# Patient Record
Sex: Male | Born: 1945 | Race: White | Hispanic: No | Marital: Married | Smoking: Current every day smoker
Health system: Southern US, Community
[De-identification: ages and names within clinical notes are randomized; demographics above are authoritative.]

## PROBLEM LIST (undated history)

## (undated) DIAGNOSIS — IMO0001 Reserved for inherently not codable concepts without codable children: Secondary | ICD-10-CM

## (undated) DIAGNOSIS — I4891 Unspecified atrial fibrillation: Secondary | ICD-10-CM

## (undated) DIAGNOSIS — I10 Essential (primary) hypertension: Secondary | ICD-10-CM

## (undated) DIAGNOSIS — I499 Cardiac arrhythmia, unspecified: Secondary | ICD-10-CM

## (undated) DIAGNOSIS — I509 Heart failure, unspecified: Secondary | ICD-10-CM

## (undated) DIAGNOSIS — J449 Chronic obstructive pulmonary disease, unspecified: Secondary | ICD-10-CM

---

## 2004-08-07 ENCOUNTER — Ambulatory Visit: Payer: Self-pay

## 2014-05-27 ENCOUNTER — Ambulatory Visit: Payer: Self-pay | Admitting: Internal Medicine

## 2014-06-11 ENCOUNTER — Inpatient Hospital Stay: Payer: Self-pay | Admitting: Internal Medicine

## 2014-06-11 LAB — BASIC METABOLIC PANEL
Anion Gap: 6 — ABNORMAL LOW (ref 7–16)
BUN: 14 mg/dL (ref 7–18)
CHLORIDE: 105 mmol/L (ref 98–107)
CO2: 30 mmol/L (ref 21–32)
Calcium, Total: 8.3 mg/dL — ABNORMAL LOW (ref 8.5–10.1)
Creatinine: 0.61 mg/dL (ref 0.60–1.30)
EGFR (African American): >60
Glucose: 136 mg/dL — ABNORMAL HIGH (ref 65–99)
Osmolality: 284 (ref 275–301)
POTASSIUM: 4 mmol/L (ref 3.5–5.1)
SODIUM: 141 mmol/L (ref 136–145)

## 2014-06-11 LAB — HEPATIC FUNCTION PANEL A (ARMC)
AST: 21 U/L (ref 15–37)
Albumin: 3 g/dL — ABNORMAL LOW (ref 3.4–5.0)
Alkaline Phosphatase: 86 U/L
Bilirubin,Total: 0.4 mg/dL (ref 0.2–1.0)
SGPT (ALT): 14 U/L
Total Protein: 7.9 g/dL (ref 6.4–8.2)

## 2014-06-11 LAB — CBC
HCT: 45.5 % (ref 40.0–52.0)
HGB: 14.5 g/dL (ref 13.0–18.0)
MCH: 31.7 pg (ref 26.0–34.0)
MCHC: 31.9 g/dL — AB (ref 32.0–36.0)
MCV: 99 fL (ref 80–100)
PLATELETS: 521 10*3/uL — AB (ref 150–440)
RBC: 4.58 10*6/uL (ref 4.40–5.90)
RDW: 14.4 % (ref 11.5–14.5)
WBC: 17.8 10*3/uL — ABNORMAL HIGH (ref 3.8–10.6)

## 2014-06-11 LAB — TROPONIN I: Troponin-I: <0.02 ng/mL

## 2014-06-11 LAB — PRO B NATRIURETIC PEPTIDE: B-Type Natriuretic Peptide: 477 pg/mL — ABNORMAL HIGH (ref 0–125)

## 2014-06-12 LAB — CBC WITH DIFFERENTIAL/PLATELET
BASOS ABS: 0 10*3/uL (ref 0.0–0.1)
Basophil %: 0.2 %
Eosinophil #: 0 10*3/uL (ref 0.0–0.7)
Eosinophil %: 0 %
HCT: 37.3 % — ABNORMAL LOW (ref 40.0–52.0)
HGB: 11.8 g/dL — ABNORMAL LOW (ref 13.0–18.0)
Lymphocyte #: 1 10*3/uL (ref 1.0–3.6)
Lymphocyte %: 9.7 %
MCH: 31.1 pg (ref 26.0–34.0)
MCHC: 31.6 g/dL — AB (ref 32.0–36.0)
MCV: 98 fL (ref 80–100)
MONOS PCT: 4.7 %
Monocyte #: 0.5 x10 3/mm (ref 0.2–1.0)
Neutrophil #: 8.5 10*3/uL — ABNORMAL HIGH (ref 1.4–6.5)
Neutrophil %: 85.4 %
PLATELETS: 382 10*3/uL (ref 150–440)
RBC: 3.8 10*6/uL — ABNORMAL LOW (ref 4.40–5.90)
RDW: 14.1 % (ref 11.5–14.5)
WBC: 10 10*3/uL (ref 3.8–10.6)

## 2014-06-12 LAB — BASIC METABOLIC PANEL
ANION GAP: 6 — AB (ref 7–16)
BUN: 22 mg/dL — ABNORMAL HIGH (ref 7–18)
CO2: 28 mmol/L (ref 21–32)
Calcium, Total: 8.2 mg/dL — ABNORMAL LOW (ref 8.5–10.1)
Chloride: 102 mmol/L (ref 98–107)
Creatinine: 0.81 mg/dL (ref 0.60–1.30)
EGFR (Non-African Amer.): >60
Glucose: 104 mg/dL — ABNORMAL HIGH (ref 65–99)
Osmolality: 276 (ref 275–301)
POTASSIUM: 4 mmol/L (ref 3.5–5.1)
Sodium: 136 mmol/L (ref 136–145)

## 2014-06-16 LAB — PLATELET COUNT: Platelet: 304 10*3/uL (ref 150–440)

## 2014-06-17 LAB — BRONCHIAL WASH CULTURE

## 2014-06-19 LAB — PLATELET COUNT: PLATELETS: 334 10*3/uL (ref 150–440)

## 2014-06-20 LAB — CREATININE, SERUM
Creatinine: 0.65 mg/dL (ref 0.60–1.30)
EGFR (African American): >60
EGFR (Non-African Amer.): >60

## 2014-06-20 LAB — PLATELET COUNT: Platelet: 358 10*3/uL (ref 150–440)

## 2014-06-24 LAB — PLATELET COUNT: Platelet: 400 10*3/uL (ref 150–440)

## 2014-06-28 LAB — CBC WITH DIFFERENTIAL/PLATELET
BASOS ABS: 0.1 10*3/uL (ref 0.0–0.1)
BASOS PCT: 0.6 %
EOS PCT: 16.6 %
Eosinophil #: 1.8 10*3/uL — ABNORMAL HIGH (ref 0.0–0.7)
HCT: 31.7 % — AB (ref 40.0–52.0)
HGB: 10.3 g/dL — ABNORMAL LOW (ref 13.0–18.0)
LYMPHS ABS: 1.9 10*3/uL (ref 1.0–3.6)
Lymphocyte %: 17.2 %
MCH: 32.4 pg (ref 26.0–34.0)
MCHC: 32.6 g/dL (ref 32.0–36.0)
MCV: 99 fL (ref 80–100)
Monocyte #: 1.2 x10 3/mm — ABNORMAL HIGH (ref 0.2–1.0)
Monocyte %: 10.9 %
NEUTROS ABS: 6 10*3/uL (ref 1.4–6.5)
Neutrophil %: 54.7 %
Platelet: 490 10*3/uL — ABNORMAL HIGH (ref 150–440)
RBC: 3.19 10*6/uL — ABNORMAL LOW (ref 4.40–5.90)
RDW: 15 % — AB (ref 11.5–14.5)
WBC: 10.9 10*3/uL — ABNORMAL HIGH (ref 3.8–10.6)

## 2014-07-04 LAB — CULTURE, FUNGUS WITHOUT SMEAR

## 2014-07-07 ENCOUNTER — Ambulatory Visit: Payer: Self-pay | Admitting: Cardiothoracic Surgery

## 2014-07-10 ENCOUNTER — Ambulatory Visit: Payer: Self-pay | Admitting: Cardiothoracic Surgery

## 2014-07-25 ENCOUNTER — Ambulatory Visit: Payer: Self-pay | Admitting: Internal Medicine

## 2014-11-28 ENCOUNTER — Ambulatory Visit: Payer: Self-pay | Admitting: Internal Medicine

## 2014-12-31 NOTE — Op Note (Signed)
PATIENT NAME:  Jeremy Fields, Jaan M MR#:  [id number] DATE OF BIRTH:  Oct 29, 1945  DATE OF PROCEDURE:  06/22/2014  SURGEON: Jasmine Decemberimothy E Saqib Cazarez, MD.   PREOPERATIVE DIAGNOSIS: Persistent air leak.   POSTOPERATIVE DIAGNOSIS: Persistent air leak.  OPERATION PERFORMED: Talc pleurodesis using talc slurry.   INDICATIONS FOR PROCEDURE: [name] is a 69 year old gentleman with severe bullous lung disease who has been in the hospital now for approximately 10 days with a persistent air leak following a spontaneous pneumothorax. The indications and risks were explained to the patient who gave his informed consent.   DESCRIPTION OF PROCEDURE: The patient was resting comfortably in his bed and a timeout was called. The appropriate site and patient were identified. Intravenous sedation was given by our anesthesia staff. The chest tube was prepped and 100 mg of lidocaine was instilled and 10 minutes later 5 grams of sterile talc were instilled via the chest tube. The chest tubing was looped up over an IV pole but not clamped. It was left to gravity drainage. The patient received intravenous sedation but did complain of some significant pain during the procedure, but this certainly was helped by narcotics. He tolerated the procedure well and was then taken back to the floor in stable condition.    ____________________________ Sheppard Plumberimothy E. Thelma Bargeaks, MD teo:lt D: 06/22/2014 11:36:37 ET T: 06/22/2014 14:48:02 ET JOB#: [id number]  cc: Sheppard Plumberimothy E. Thelma Bargeaks, MD, <Dictator> Jasmine DecemberIMOTHY E Wilber Fini MD ELECTRONICALLY SIGNED 06/28/2014 10:56

## 2014-12-31 NOTE — Op Note (Signed)
PATIENT NAME:  Jeremy Fields, Jeremy Fields MR#:  [id number] DATE OF BIRTH:  1946/06/17  DATE OF PROCEDURE:  06/15/2014  SURGEON: Jasmine Decemberimothy E Dorcas Melito, MD.   ASSISTANT: None.   PREOPERATIVE DIAGNOSIS: Right-sided pneumothorax.   POSTOPERATIVE DIAGNOSIS: Right side pneumothorax.  OPERATION PERFORMED: Insertion of right-sided chest tube.   INDICATIONS FOR PROCEDURE: [name] is a 69 year old gentleman who came in with a spontaneous pneumothorax. Over the course of the last several days his chest tube has migrated out of the pleural space and reinsertion of a  chest tube was required to expand his lung and to keep the lung fully inflated. The indications and risks were explained to the patient who gave his informed consent.   DESCRIPTION OF PROCEDURE: The patient was brought to the recovery room where he was appropriately managed by anesthesia using monitored anesthesia care. A timeout was called. The patient was prepped and draped in the usual sterile fashion after the right-sided chest tube had been removed. Indeed, it was only within the subcutaneous tissues. A small skin wheal was raised just anterior to the prior one and local anesthesia was used. The skin incision was made and using blunt and sharp dissection the subcutaneous tract was created. A 28 French chest tube was then inserted into the pleural space. The tube was secured with #1 silk and a small pursestring suture was placed, as well. The previous chest tube site was just covered with gauze and the new chest tube site was covered with Vaseline gauze and sterile 4 x 4s. Sterile dressings were applied and the patient was then rolled into the supine position where a chest x-ray showed the tube to be in good position with the lung fully expanded.   The patient tolerated the procedure well and was then taken back to his room in stable condition.    ____________________________ Sheppard Plumberimothy E. Thelma Bargeaks, MD teo:lt D: 06/15/2014 13:01:03 ET T: 06/15/2014 15:33:17  ET JOB#: [id number]  cc: Sheppard Plumberimothy E. Thelma Bargeaks, MD, <Dictator> Jasmine DecemberIMOTHY E Teona Vargus MD ELECTRONICALLY SIGNED 06/28/2014 10:56

## 2014-12-31 NOTE — Op Note (Signed)
PATIENT NAME:  Jeremy Fields, [name] MR#:  [id number] DATE OF BIRTH:  09/29/1945  DATE OF PROCEDURE:  06/11/2014  PREOPERATIVE DIAGNOSIS: Right pneumothorax with exacerbation of chronic lung disease.  POSTOPERATIVE DIAGNOSIS: Right pneumothorax with exacerbation of chronic lung disease.    OPERATION: Right tube thoracostomy.   ANESTHESIA: Local.   SURGEON: Quentin Orealph L. Ely, III, MD   OPERATIVE PROCEDURE: With the patient in the left side down position, his right chest was prepped with Betadine and draped with sterile towels. Next, 0.25% Marcaine was used to anesthetize the anterior axillary line at approximately the seventh intercostal space. After satisfactory anesthesia, a small incision was made and a 20 French chest tube inserted without difficulty. Immediate return of air was noted and the patient's respiratory symptoms improved dramatically. The tube was secured with 2-0 silk and placed under water seal on suction. Chest x-ray is pending.    ____________________________ Carmie Endalph L. Ely III, MD rle:MT D: 06/11/2014 15:50:25 ET T: 06/12/2014 07:52:20 ET JOB#: [id number]  cc: Carmie Endalph L. Ely III, MD, <Dictator> Yevonne PaxSaadat A. Khan, MD Quentin OreALPH L ELY MD ELECTRONICALLY SIGNED 06/12/2014 9:44

## 2014-12-31 NOTE — Discharge Summary (Signed)
PATIENT NAME:  Jeremy Fields, Abdiaziz M MR#:  [id number] DATE OF BIRTH:  [birth year]  DATE OF ADMISSION:  06/11/2014 DATE OF DISCHARGE:    ADMITTING DIAGNOSIS: Pneumothorax.   DISCHARGE DIAGNOSES:  1.  Spontaneous right pneumothorax status post right chest tube placement on 06/11/2014, reinsertion on 06/15/2014, and removal on 06/28/2014.   2.  Status post talc pleurodesis on 06/22/2014 due to persistent air leak.  3.  Status post bronchoscopy on 06/14/2014 due to abnormal chest CT, Klebsiella oxytocia, pseudomonas, as well as Haemophilus pneumoniae were noted as per bronchial washings, status post treatment with Levaquin.  4.  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   5.  Ongoing tobacco abuse.  6.  Hypertension.  7.  Constipation.  8.  Slurred speech of unclear etiology, resolved, with negative CT scan of head.  9.  History of transient ischemic attack in the past.   DISCHARGE CONDITION: Stable.   DISCHARGE MEDICATIONS:  1.  The patient is to continue Coreg 3.125 mg p.o. twice daily.    2.  Stiolto Respimat 2.5/2.5 two puffs once daily.  3.  ProAir HFA 2 puffs 4 times daily.  4.  Acetaminophen/Oxycodone 325 mg/5 mg 1 tablet every 4 hours as needed.  5.  Aspirin 81 mg p.o. daily.  6.  Albuterol ipratropium 2.5/0.5 mg in 3 mL inhalation solution 1 inhalation up to 6 times daily as needed.  7.  Budesonide formoterol 160/4.5 two puffs twice daily.  8.  Polyethylene glycol 17 grams once daily as needed.  9.  Nicotine transdermal patch 21 mg topically daily.   10.  Nicotine oral inhaler 1 inhalation every 2 hours as needed.   The patient is to stop prednisone and clindamycin unless recommended by primary care physician.   HOME OXYGEN: None.   DIET: 2 grams salt, low fat, low cholesterol, regular consistency.   ACTIVITY LIMITATIONS: As tolerated.   FOLLOWUP APPOINTMENT: With Dr. Thelma Bargeaks in 1 week after discharge, Dr. Freda MunroSaadat Khan in 2 days after discharge.    CONSULTANTS: Care management, social work, Dr. Thelma Bargeaks, Dr. Excell Seltzerooper, Dr. Anda KraftMarterre,   Dr. Michela PitcherEly, Dr. Freda MunroSaadat Khan.   RADIOLOGIC STUDIES:  From 06/25/2014 until the day of discharge as follows:  Chest portable single view 06/26/2014, stable position of right chest tube without evidence of recurrent pneumothorax, persistent right chest wall emphysema. Repeated chest x-ray PA and lateral 06/28/2014 revealing no change from prior exam, no visible pneumothorax, no pleural effusion, stable position of right chest tube, and stable right chest wall subcutaneous emphysema. Please refer to prior dictated interim discharge summary by Dr. Hilton SinclairWeiting 06/17/2014 and Dr. Auburn BilberryShreyang Patel 06/24/2014.    HOSPITAL COURSE:  The patient is a 69 year old Caucasian male with a history of emphysema, ongoing tobacco abuse, who presents to the hospital on 06/11/2014 with shortness of breath. Please refer to Dr. Larose HiresVachhani's admission on 06/11/2014. On arrival to the hospital he was afebrile with temperature of 97.5, pulse 112, respirations 26, blood pressure 141/94, O2 saturations were 91% on oxygen therapy.  Chest x-ray revealed moderate right-sided pneumothorax. The patient was admitted to the hospital for further evaluation. Please refer to the hospital course through 06/24/2014 for prior recommended interim discharge summaries.  Over the past few days since 06/25/2014 to the day of discharge 06/28/2014 the patient has felt satisfactory, did not have any significant discomfort or shortness of breath or cough or productive sputum. His chest tube was continued to be watched by surgeons and finally removed by Dr. Thelma Bargeaks today on  06/28/2014.  Dr. Thelma Barge recommended to follow up with him at cancer center with chest x-ray prior to the visit. His vital signs were stable, his temperature was 97.7, pulse was 91, respiration was 18, blood pressure 132/69, saturation was 95%-96% on room air at rest as well as on exertion. In regards to chronic COPD the  patient is to continue to follow up with his primary physician Dr. Freda Munro and resume his steroids if needed. His condition is relatively very stable and I do not feel that he needs to be on steroid therapy at this time to insure healing. In regards to prior pneumonia, Klebsiella, pseudomonas, as well as Haemophilus noted on bronchial washings after bronchoscopy. The patient was treated with Levaquin and finished his therapy. In regards to ongoing tobacco abuse the patient was counseled and recommended nicotine replacement therapy. For hypertension the patient is to continue his outpatient medications. For history of slurring of speech no further slurring was noted, although since the patient had a history of TIA he was recommended aspirin therapy as well as low fat, low cholesterol diet.   The patient is being discharged in stable condition with above-mentioned medications and followup.   TIME SPENT: 40 minutes on this patient.    ____________________________ Katharina Caper, MD rv:bu D: 06/28/2014 13:34:13 ET T: 06/28/2014 14:34:46 ET JOB#: [id number]  cc: Katharina Caper, MD, <Dictator> Timothy E. Thelma Barge, MD Yevonne Pax, MD Marlayna Bannister Winona Legato MD ELECTRONICALLY SIGNED 07/08/2014 12:51

## 2014-12-31 NOTE — Discharge Summary (Signed)
PATIENT NAME:  Jeremy Fields, English M MR#:  [id number] DATE OF BIRTH:  03-Mar-1946  DATE OF ADMISSION:  06/11/2014 DATE OF DISCHARGE:    This interim discharge summary covers from October 10 until October 16.  DIAGNOSES: At the time of interim discharge summary:  1. Pneumothorax with persistent leak, status post pleurodesis.  2. Abnormal CT scan of the chest with atypical infection with Klebsiella and Pseudomonas on the bronchoscopy, status post treatment with Levaquin.  3. Chronic obstructive pulmonary disease and tobacco dependence.  4. Hypertension.  5. Slurred speech negative CT scan.  6. Constipation.   CONSULTANTS: That saw the patient during this interim timeframe were Dr. Anda KraftMarterre and Dr. Thelma Bargeaks.   PERTINENT EVALUATIONS: Most recent chest x-ray on October 15 shows stable right chest tube with suspected tiny medial pneumothorax.   MEDICATIONS: At the time of interim summary: Acetaminophen/oxycodone 325/5 mg 1 to 2 tablets every 4 p.r.n., carvedilol 3.125 one tablet p.o. b.i.d., heparin 5000 subcutaneous every 8, nicotine 21 mg daily, albuterol 2 puffs every 6 p.r.n., Symbicort 2 puffs b.i.d., Ambien 5 at bedtime, MiraLax 17 grams daily, lactulose 30 mL b.i.d. p.r.n., aspirin 81 mg 1 tablet p.o. daily, Tylenol 650 every 4 p.r.n.   PROCEDURES: Talc pleurodesis using talc slurry.   HOSPITAL COURSE: Please refer to interim summary done by Dr. Renae GlossWieting from October 3 to October 9. The patient has been in the hospital due to a chest tube. Even though there was no pneumothorax, he continued to have air leak. Therefore, he was followed by Dr. Thelma Bargeaks during this timeframe and it was finally decided to do a talc pleurodesis in the OR. The patient tolerated the procedure well; however, he still has a persistent air leak with the chest tube. Therefore, it is recommended by surgery to keep the chest tube in place with continuous suctioning. The patient will be monitored over the weekend and re-evaluated again by  Dr. Thelma Bargeaks on Monday.   TIME SPENT: 35 minutes.     ____________________________ Lacie ScottsShreyang H. Allena KatzPatel, MD shp:TT D: 06/24/2014 13:51:33 ET T: 06/24/2014 14:11:44 ET JOB#: [id number]  cc: Odas Ozer H. Allena KatzPatel, MD, <Dictator>

## 2014-12-31 NOTE — H&P (Signed)
PATIENT NAME:  Jeremy Fields, Jeremy Fields MR#:  [id number] DATE OF BIRTH:  25-Jan-1946  DATE OF ADMISSION:  06/11/2014  PRIMARY CARE PHYSICIAN: None.   PRIMARY PULMONOLOGIST: Dr. Freda MunroSaadat Khan.   REFERRING PHYSICIAN: Dr. Mindi JunkerGottlieb.    CHIEF COMPLAINT: Shortness of breath.   HISTORY OF PRESENTING ILLNESS: A 69 year old male who is a chronic smoker, smoked more than 2 packs for almost 40 years and currently trying to cut down by smoking 25 to 30 cigarettes every day, has severe emphysema. He was living in [place] until a month ago. Over there, in April 2015 because of suspicion of a mass in right upper lobe he had lung biopsy done which came out to be negative for cancer, as per patient and his daughter who is in the room. After he moved over here, he saw Dr. Freda MunroSaadat Khan because he wanted have a pulmonary doctor for his care. Dr. Welton FlakesKhan did a CT of the chest which showed atypical infection and so he started him on clindamycin, which he started on September 23. He started steroid tapering 40 mg for 1 week then 30 for 1 week and so on up until 1 month which he was taking and feeling fine until last night. Today morning he woke up and he does not feel good. He was feeling some short of breath. He was not able to walk even 5 to 6 steps without getting severe short of breath so decided to come to Emergency Room. On arrival he was hypoxic on room air and putting on nasal cannula he was saturating fine. He was given chest x-ray and found having a pneumothorax on the right side with some atypical infiltrate in the lungs, both sides. Surgical consult called in and Dr. Michela PitcherEly put a chest tube in and given to hospitalist team for further management for admission. On further questioning, he denies any fever or chills or worsening of his cough or sputum production. He has some baseline cough and minor sputum production because of his severe emphysema and excessive smoking, but there is no worsening in that. He denies any chest pain or  any palpitations. Currently after putting chest tube in, he is able to tolerate room air and breathe fine without any problem.   REVIEW OF SYSTEMS:  CONSTITUTIONAL: Negative for fever, fatigue, weakness, pain, or weight loss.  EYES: No blurring, double vision, discharge or redness.  EARS, NOSE, THROAT: No tinnitus, ear pain or hearing loss.  RESPIRATORY: No cough, wheezing but had severe shortness of breath, but after putting in chest tube it resolved.  CARDIOVASCULAR: No chest pain, orthopnea, edema, arrhythmia, palpitations.  GASTROINTESTINAL: No nausea, vomiting, diarrhea, abdominal pain.  GENITOURINARY: No dysuria, hematuria, or increased frequency.  ENDOCRINE: No heat or cold intolerance. No excessive sweating.  SKIN: No acne, rashes, or lesions.  MUSCULOSKELETAL: No pain or swelling in the joints.  NEUROLOGICAL: No numbness, weakness, tremor or vertigo.  PSYCHIATRIC: No anxiety, insomnia, bipolar disorder.   PAST MEDICAL HISTORY: 1.  Hypertension.  2.  COPD and excessive smoking and emphysema.  3.  Transient ischemic attack, which was never worked up because he did not go to hospital.   PAST SURGICAL HISTORY:  1.  Appendicitis and appendectomy as a child.  2.  Lung biopsy in April 2015, which came out to be negative for cancer.   SOCIAL HISTORY: Currently smokes 25 to 30 cigarettes every day, but he had been smoking for 45 years and he was smoking more than 2 packs every day.  Drinks alcohol socially, very rare, almost once a week and denies doing any illegal drug use.   FAMILY HISTORY: Positive for stroke in the brother, and mother and father both had coronary artery disease.   HOME MEDICATIONS:  1.  Clindamycin 300 mg 3 times a day.  2.  Albuterol inhaler 4 times a day as needed.  3.  Tiotropium and olodaterol inhaler 2 puffs once a day.  4.  Prednisone 10 mg tablets. He was currently on 4 tablets once a day, cycle for 1 week.  5.  Carvedilol 3.125 mg oral b.i.d.   PHYSICAL  EXAMINATION:  VITAL SIGNS: In the ER temperature 97.5, pulse 112, respirations 26 on presentation currently 18, and blood pressure was 141/94 and oxygen saturation was 91% with supplemental oxygen. Currently, he is 97% on room air.  GENERAL: The patient is thin, alert and oriented to time, place, and person. Does not appear in any acute distress.  HEENT: Head and neck atraumatic. Conjunctivae pink. Oral mucosa moist.  NECK: Supple. No JVD.  RESPIRATORY: Bilateral equal air entry, but overall decreased air sounds present. No wheezing or crepitation. There is a chest tube present in the right lower chest.  CARDIOVASCULAR: S1, S2 present, regular. No murmur.  ABDOMEN: Soft, nontender. Bowel sounds present. No organomegaly.  SKIN: No rashes or lesions.  MUSCULOSKELETAL: No pain or swelling in the joints.  NEUROLOGICAL: No numbness, weakness, tremor or rigidity. Follows commands. Power 5/5.  PSYCHIATRIC: Does not appear in any acute psychiatric illness.   IMPORTANT LABORATORY RESULTS: Chest x-ray PA and lateral, which was done initially on arrival to the ER shows moderate sized right basilar pneumothorax, bilaterally nodular airspace opacity with right upper lobe architecture distortion and volume loss similar to recently performed CT chest on September 18.   Glucose 136, BNP 477, BUN 14, creatinine 0.61, sodium 141, potassium 4, chloride 105, CO2 of 30, calcium is 8.3.   Total protein 7.9, albumin 3, bilirubin 0.4, bilirubin 0.1, alkaline phosphate 86, SGOT 21, SGPT 14, troponin less than 0.02. WBC 17.8, hemoglobin 14.5, platelet count 521,000, MCV is 99.   On ABG, pH was 7.43, pCO2 was 43, and pO2 was 49 on 32% oxygen via nasal cannula.   ASSESSMENT AND PLAN: A 69 year old male with past history of chronic obstructive pulmonary disease emphysema, hypertension, transient ischemic attack, chronic and heavy smoking history. Currently on clindamycin and prednisone because of atypical infection, came to  Emergency Room with severe shortness of breath sudden onset and found having a pneumothorax status post chest tube placement.  1.  Pneumothorax. This was the reason for acute respiratory failure requiring oxygen and having tachypnea on presentation. Status post chest tube placement by surgical team and will continue monitoring for resolution of his air leakage. Meanwhile, continue monitoring.  2.  Atypical infection, as evident by CT of the chest and x-ray. He is on treatment for atypical pneumonia by clindamycin described by Dr. Freda Munro. Currently will just continue that same and call consult with Dr. Welton Flakes.  3.  Severe terminal emphysema and chronic obstructive pulmonary disease. Currently, there is no active wheezing so there is no exacerbation. We will just continue his baseline DuoNeb nebulizer treatment every 4 hours. As he was on a tapering steroid by Dr. Freda Munro, will just continue the same over here.  4.  Hypertension. Blood pressure is slightly on the higher side. We will just continue carvedilol as he was taking at home.  5.  Smoking. Smoking cessation counseling is  done for 4 minutes. Offered him nicotine patch and he agreed to think about his smoking cessation. Total time spent for that is 4 minutes.   CODE STATUS: Full code.   TOTAL TIME SPENT ON THIS ADMISSION: 50 minutes.    ____________________________ Hope Pigeon Elisabeth Pigeon, MD vgv:at D: 06/11/2014 16:34:08 ET T: 06/11/2014 17:14:53 ET JOB#: [id number]  cc: Hope Pigeon. Elisabeth Pigeon, MD, <Dictator> Yevonne Pax, MD Altamese Dilling MD ELECTRONICALLY SIGNED 06/13/2014 13:30

## 2015-05-17 ENCOUNTER — Other Ambulatory Visit: Payer: Self-pay | Admitting: Internal Medicine

## 2015-05-17 DIAGNOSIS — A319 Mycobacterial infection, unspecified: Secondary | ICD-10-CM

## 2015-06-08 ENCOUNTER — Ambulatory Visit
Admission: RE | Admit: 2015-06-08 | Discharge: 2015-06-08 | Disposition: A | Discharge status: Home / Self Care | Payer: Medicare PPO | Source: Ambulatory Visit | Attending: Internal Medicine | Admitting: Internal Medicine

## 2015-06-08 DIAGNOSIS — I2510 Atherosclerotic heart disease of native coronary artery without angina pectoris: Secondary | ICD-10-CM | POA: Diagnosis not present

## 2015-06-08 DIAGNOSIS — I700 Atherosclerosis of aorta: Secondary | ICD-10-CM | POA: Insufficient documentation

## 2015-06-08 DIAGNOSIS — A319 Mycobacterial infection, unspecified: Secondary | ICD-10-CM | POA: Insufficient documentation

## 2015-06-08 DIAGNOSIS — J439 Emphysema, unspecified: Secondary | ICD-10-CM | POA: Diagnosis not present

## 2015-06-08 MED ORDER — IOHEXOL 300 MG/ML  SOLN
75.0000 mL | Freq: Once | INTRAMUSCULAR | Status: AC | PRN
  Administered 2015-06-08: 75 mL via INTRAVENOUS

## 2015-08-16 ENCOUNTER — Other Ambulatory Visit: Payer: Self-pay | Admitting: Rheumatology

## 2015-08-16 DIAGNOSIS — R52 Pain, unspecified: Secondary | ICD-10-CM

## 2015-09-15 ENCOUNTER — Ambulatory Visit
Admission: RE | Admit: 2015-09-15 | Discharge: 2015-09-15 | Disposition: A | Discharge status: Home / Self Care | Payer: Medicare PPO | Source: Ambulatory Visit | Attending: Rheumatology | Admitting: Rheumatology

## 2015-09-15 DIAGNOSIS — R52 Pain, unspecified: Secondary | ICD-10-CM

## 2016-01-03 ENCOUNTER — Ambulatory Visit
Admission: RE | Admit: 2016-01-03 | Discharge: 2016-01-03 | Disposition: A | Discharge status: Home / Self Care | Payer: Medicare PPO | Source: Ambulatory Visit | Attending: Physician Assistant | Admitting: Physician Assistant

## 2016-01-03 ENCOUNTER — Other Ambulatory Visit: Payer: Self-pay | Admitting: Physician Assistant

## 2016-01-03 DIAGNOSIS — R0602 Shortness of breath: Secondary | ICD-10-CM | POA: Insufficient documentation

## 2016-01-03 DIAGNOSIS — R918 Other nonspecific abnormal finding of lung field: Secondary | ICD-10-CM | POA: Diagnosis not present

## 2016-01-25 ENCOUNTER — Other Ambulatory Visit: Payer: Self-pay | Admitting: Physician Assistant

## 2016-01-25 DIAGNOSIS — I2510 Atherosclerotic heart disease of native coronary artery without angina pectoris: Secondary | ICD-10-CM

## 2016-02-21 ENCOUNTER — Emergency Department: Payer: Medicare PPO

## 2016-02-21 ENCOUNTER — Encounter: Payer: Self-pay | Admitting: Emergency Medicine

## 2016-02-21 ENCOUNTER — Inpatient Hospital Stay
Admission: EM | Admit: 2016-02-21 | Discharge: 2016-02-24 | DRG: 871 | Disposition: A | Discharge status: Home Health | Payer: Medicare PPO | Attending: Internal Medicine | Admitting: Internal Medicine

## 2016-02-21 DIAGNOSIS — J9621 Acute and chronic respiratory failure with hypoxia: Secondary | ICD-10-CM | POA: Diagnosis present

## 2016-02-21 DIAGNOSIS — J181 Lobar pneumonia, unspecified organism: Secondary | ICD-10-CM | POA: Diagnosis present

## 2016-02-21 DIAGNOSIS — Z7951 Long term (current) use of inhaled steroids: Secondary | ICD-10-CM

## 2016-02-21 DIAGNOSIS — R7989 Other specified abnormal findings of blood chemistry: Secondary | ICD-10-CM

## 2016-02-21 DIAGNOSIS — Z9119 Patient's noncompliance with other medical treatment and regimen: Secondary | ICD-10-CM

## 2016-02-21 DIAGNOSIS — A419 Sepsis, unspecified organism: Secondary | ICD-10-CM | POA: Diagnosis present

## 2016-02-21 DIAGNOSIS — I959 Hypotension, unspecified: Secondary | ICD-10-CM | POA: Diagnosis present

## 2016-02-21 DIAGNOSIS — I480 Paroxysmal atrial fibrillation: Secondary | ICD-10-CM | POA: Diagnosis present

## 2016-02-21 DIAGNOSIS — Z8249 Family history of ischemic heart disease and other diseases of the circulatory system: Secondary | ICD-10-CM | POA: Diagnosis not present

## 2016-02-21 DIAGNOSIS — E876 Hypokalemia: Secondary | ICD-10-CM | POA: Diagnosis present

## 2016-02-21 DIAGNOSIS — J189 Pneumonia, unspecified organism: Secondary | ICD-10-CM | POA: Diagnosis present

## 2016-02-21 DIAGNOSIS — F17200 Nicotine dependence, unspecified, uncomplicated: Secondary | ICD-10-CM | POA: Diagnosis present

## 2016-02-21 DIAGNOSIS — Z8619 Personal history of other infectious and parasitic diseases: Secondary | ICD-10-CM

## 2016-02-21 DIAGNOSIS — R778 Other specified abnormalities of plasma proteins: Secondary | ICD-10-CM

## 2016-02-21 DIAGNOSIS — Z7952 Long term (current) use of systemic steroids: Secondary | ICD-10-CM | POA: Diagnosis not present

## 2016-02-21 DIAGNOSIS — R748 Abnormal levels of other serum enzymes: Secondary | ICD-10-CM | POA: Diagnosis present

## 2016-02-21 DIAGNOSIS — R000 Tachycardia, unspecified: Secondary | ICD-10-CM

## 2016-02-21 DIAGNOSIS — Z7901 Long term (current) use of anticoagulants: Secondary | ICD-10-CM | POA: Diagnosis not present

## 2016-02-21 DIAGNOSIS — E871 Hypo-osmolality and hyponatremia: Secondary | ICD-10-CM | POA: Diagnosis present

## 2016-02-21 DIAGNOSIS — J440 Chronic obstructive pulmonary disease with acute lower respiratory infection: Secondary | ICD-10-CM | POA: Diagnosis present

## 2016-02-21 HISTORY — DX: Chronic obstructive pulmonary disease, unspecified: J44.9

## 2016-02-21 HISTORY — DX: Unspecified atrial fibrillation: I48.91

## 2016-02-21 LAB — URINALYSIS COMPLETE WITH MICROSCOPIC (ARMC ONLY)
BACTERIA UA: NONE SEEN
Bilirubin Urine: NEGATIVE
GLUCOSE, UA: NEGATIVE mg/dL
HGB URINE DIPSTICK: NEGATIVE
Ketones, ur: NEGATIVE mg/dL
LEUKOCYTES UA: NEGATIVE
Nitrite: NEGATIVE
PH: 5 (ref 5.0–8.0)
Protein, ur: 30 mg/dL — AB
Specific Gravity, Urine: 1.017 (ref 1.005–1.030)

## 2016-02-21 LAB — CBC
HCT: 39.7 % — ABNORMAL LOW (ref 40.0–52.0)
Hemoglobin: 13.1 g/dL (ref 13.0–18.0)
MCH: 28.6 pg (ref 26.0–34.0)
MCHC: 33 g/dL (ref 32.0–36.0)
MCV: 86.8 fL (ref 80.0–100.0)
PLATELETS: 358 10*3/uL (ref 150–440)
RBC: 4.57 MIL/uL (ref 4.40–5.90)
RDW: 17.7 % — ABNORMAL HIGH (ref 11.5–14.5)
WBC: 36.3 10*3/uL — ABNORMAL HIGH (ref 3.8–10.6)

## 2016-02-21 LAB — TROPONIN I: TROPONIN I: 0.04 ng/mL — AB (ref <0.031)

## 2016-02-21 LAB — BLOOD GAS, VENOUS
ACID-BASE EXCESS: 4.7 mmol/L — AB (ref 0.0–3.0)
BICARBONATE: 28.2 meq/L — AB (ref 21.0–28.0)
FIO2: 21
O2 SAT: 93.5 %
PH VEN: 7.49 — AB (ref 7.320–7.430)
Patient temperature: 37
pCO2, Ven: 37 mmHg — ABNORMAL LOW (ref 44.0–60.0)
pO2, Ven: 63 mmHg — ABNORMAL HIGH (ref 31.0–45.0)

## 2016-02-21 LAB — BASIC METABOLIC PANEL
ANION GAP: 12 (ref 5–15)
BUN: 24 mg/dL — ABNORMAL HIGH (ref 6–20)
CALCIUM: 8.6 mg/dL — AB (ref 8.9–10.3)
CO2: 23 mmol/L (ref 22–32)
CREATININE: 0.82 mg/dL (ref 0.61–1.24)
Chloride: 98 mmol/L — ABNORMAL LOW (ref 101–111)
Glucose, Bld: 134 mg/dL — ABNORMAL HIGH (ref 65–99)
Potassium: 3.9 mmol/L (ref 3.5–5.1)
SODIUM: 133 mmol/L — AB (ref 135–145)

## 2016-02-21 LAB — LACTIC ACID, PLASMA: Lactic Acid, Venous: 0.8 mmol/L (ref 0.5–2.0)

## 2016-02-21 MED ORDER — ENOXAPARIN SODIUM 40 MG/0.4ML ~~LOC~~ SOLN
40.0000 mg | SUBCUTANEOUS | Status: DC
  Administered 2016-02-21 – 2016-02-22 (×2): 40 mg via SUBCUTANEOUS
  Filled 2016-02-21 (×2): qty 0.4

## 2016-02-21 MED ORDER — NICOTINE 21 MG/24HR TD PT24
21.0000 mg | MEDICATED_PATCH | Freq: Every day | TRANSDERMAL | Status: DC
  Administered 2016-02-21 – 2016-02-24 (×4): 21 mg via TRANSDERMAL
  Filled 2016-02-21 (×4): qty 1

## 2016-02-21 MED ORDER — MOMETASONE FURO-FORMOTEROL FUM 200-5 MCG/ACT IN AERO
2.0000 | INHALATION_SPRAY | Freq: Two times a day (BID) | RESPIRATORY_TRACT | Status: DC
  Administered 2016-02-21 – 2016-02-24 (×6): 2 via RESPIRATORY_TRACT
  Filled 2016-02-21: qty 8.8

## 2016-02-21 MED ORDER — TIOTROPIUM BROMIDE MONOHYDRATE 18 MCG IN CAPS
1.0000 | ORAL_CAPSULE | Freq: Every day | RESPIRATORY_TRACT | Status: DC
  Administered 2016-02-22 – 2016-02-24 (×3): 18 ug via RESPIRATORY_TRACT
  Filled 2016-02-21: qty 5

## 2016-02-21 MED ORDER — ONDANSETRON HCL 4 MG PO TABS: 4.0000 mg | ORAL_TABLET | Freq: Four times a day (QID) | ORAL | Status: DC | PRN

## 2016-02-21 MED ORDER — PIPERACILLIN-TAZOBACTAM 3.375 G IVPB
3.3750 g | Freq: Three times a day (TID) | INTRAVENOUS | Status: DC
  Administered 2016-02-21 – 2016-02-24 (×9): 3.375 g via INTRAVENOUS
  Filled 2016-02-21 (×13): qty 50

## 2016-02-21 MED ORDER — LORAZEPAM 2 MG/ML IJ SOLN
0.5000 mg | Freq: Once | INTRAMUSCULAR | Status: AC
  Administered 2016-02-21: 0.5 mg via INTRAVENOUS
  Filled 2016-02-21: qty 1

## 2016-02-21 MED ORDER — ALPRAZOLAM 0.5 MG PO TABS
0.5000 mg | ORAL_TABLET | Freq: Once | ORAL | Status: DC
  Filled 2016-02-21: qty 1

## 2016-02-21 MED ORDER — POLYVINYL ALCOHOL 1.4 % OP SOLN
1.0000 [drp] | Freq: Every day | OPHTHALMIC | Status: DC
  Administered 2016-02-22: 1 [drp] via OPHTHALMIC
  Filled 2016-02-21: qty 15

## 2016-02-21 MED ORDER — DEXTROSE 5 % IV SOLN
500.0000 mg | Freq: Once | INTRAVENOUS | Status: AC
  Administered 2016-02-21: 500 mg via INTRAVENOUS
  Filled 2016-02-21: qty 500

## 2016-02-21 MED ORDER — LEVOFLOXACIN IN D5W 750 MG/150ML IV SOLN
750.0000 mg | INTRAVENOUS | Status: DC
  Administered 2016-02-22: 750 mg via INTRAVENOUS
  Filled 2016-02-21: qty 150

## 2016-02-21 MED ORDER — METOPROLOL SUCCINATE ER 50 MG PO TB24
50.0000 mg | ORAL_TABLET | Freq: Every day | ORAL | Status: DC
  Administered 2016-02-21: 50 mg via ORAL
  Filled 2016-02-21 (×2): qty 1

## 2016-02-21 MED ORDER — SODIUM CHLORIDE 0.9% FLUSH
3.0000 mL | Freq: Two times a day (BID) | INTRAVENOUS | Status: DC
  Administered 2016-02-21 – 2016-02-24 (×7): 3 mL via INTRAVENOUS

## 2016-02-21 MED ORDER — ACETAMINOPHEN 650 MG RE SUPP: 650.0000 mg | Freq: Four times a day (QID) | RECTAL | Status: DC | PRN

## 2016-02-21 MED ORDER — DEXTROSE 5 % IV SOLN
1.0000 g | Freq: Once | INTRAVENOUS | Status: AC
  Administered 2016-02-21: 1 g via INTRAVENOUS
  Filled 2016-02-21: qty 10

## 2016-02-21 MED ORDER — MIRTAZAPINE 15 MG PO TABS
7.5000 mg | ORAL_TABLET | Freq: Every day | ORAL | Status: DC
  Administered 2016-02-21 – 2016-02-23 (×3): 7.5 mg via ORAL
  Filled 2016-02-21 (×3): qty 1

## 2016-02-21 MED ORDER — SODIUM CHLORIDE 0.9 % IV SOLN
INTRAVENOUS | Status: DC
  Administered 2016-02-21: 18:00:00 via INTRAVENOUS

## 2016-02-21 MED ORDER — ALPRAZOLAM 0.25 MG PO TABS
0.2500 mg | ORAL_TABLET | Freq: Every evening | ORAL | Status: DC | PRN
  Administered 2016-02-21 – 2016-02-23 (×3): 0.25 mg via ORAL
  Filled 2016-02-21 (×3): qty 1

## 2016-02-21 MED ORDER — ACETAMINOPHEN 325 MG PO TABS: 650.0000 mg | ORAL_TABLET | Freq: Four times a day (QID) | ORAL | Status: DC | PRN

## 2016-02-21 MED ORDER — METOPROLOL TARTRATE 5 MG/5ML IV SOLN: 5.0000 mg | INTRAVENOUS | Status: DC | PRN

## 2016-02-21 MED ORDER — IPRATROPIUM-ALBUTEROL 0.5-2.5 (3) MG/3ML IN SOLN: 3.0000 mL | RESPIRATORY_TRACT | Status: DC | PRN

## 2016-02-21 MED ORDER — ONDANSETRON HCL 4 MG/2ML IJ SOLN: 4.0000 mg | Freq: Four times a day (QID) | INTRAMUSCULAR | Status: DC | PRN

## 2016-02-21 MED ORDER — CELECOXIB 100 MG PO CAPS
200.0000 mg | ORAL_CAPSULE | Freq: Two times a day (BID) | ORAL | Status: DC
  Administered 2016-02-21 – 2016-02-24 (×6): 200 mg via ORAL
  Filled 2016-02-21: qty 2
  Filled 2016-02-21 (×2): qty 1
  Filled 2016-02-21: qty 2
  Filled 2016-02-21: qty 1
  Filled 2016-02-21: qty 2

## 2016-02-21 MED ORDER — ROFLUMILAST 500 MCG PO TABS
500.0000 ug | ORAL_TABLET | Freq: Every day | ORAL | Status: DC
  Administered 2016-02-22 – 2016-02-24 (×3): 500 ug via ORAL
  Filled 2016-02-21 (×4): qty 1

## 2016-02-21 MED ORDER — MORPHINE SULFATE (PF) 2 MG/ML IV SOLN: 1.0000 mg | INTRAVENOUS | Status: DC | PRN

## 2016-02-21 MED ORDER — PREDNISONE 50 MG PO TABS
50.0000 mg | ORAL_TABLET | Freq: Every day | ORAL | Status: DC
  Administered 2016-02-22 – 2016-02-23 (×2): 50 mg via ORAL
  Filled 2016-02-21 (×2): qty 1

## 2016-02-21 MED ORDER — METOPROLOL TARTRATE 25 MG PO TABS: 25.0000 mg | ORAL_TABLET | Freq: Two times a day (BID) | ORAL | Status: DC

## 2016-02-21 MED ORDER — ALBUTEROL SULFATE (2.5 MG/3ML) 0.083% IN NEBU: 2.5000 mg | INHALATION_SOLUTION | Freq: Four times a day (QID) | RESPIRATORY_TRACT | Status: DC | PRN

## 2016-02-21 MED ORDER — HYDROCODONE-ACETAMINOPHEN 5-325 MG PO TABS
1.0000 | ORAL_TABLET | ORAL | Status: DC | PRN
  Administered 2016-02-22: 2 via ORAL
  Filled 2016-02-21: qty 2

## 2016-02-21 NOTE — H&P (Signed)
[facility] [place] [facility]   PATIENT NAME: Jeremy Fields    MR#:  [id number]  DATE OF BIRTH:  1946/03/16  DATE OF ADMISSION:  02/21/2016  PRIMARY CARE PHYSICIAN: Lyndon Code, MD   REQUESTING/REFERRING PHYSICIAN: Dr Sharma Covert  CHIEF COMPLAINT:   SOB  HISTORY OF PRESENT ILLNESS:  Jeremy Fields  is a 70 y.o. 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 Over the past week patient had decreased oral intake and decreased energy with shortness of breath. He denies fever or chills. He denies increased cough. He saw his pulmonologist a few days ago for routine checkup. In the emergency room chest x-ray shows a new infiltrate. He was started initially on Rocephin and azithromycin. Patient was also placed on BiPAP which will be discontinued now that he is breathing better and heart rate is better controlled. He was supposed to be evaluated for his chronic shortness of breath and dyspnea exertion by stress test 2 weeks ago however he did not make this appointment. He denies chest pain currently  PAST MEDICAL HISTORY:   Past Medical History  Diagnosis Date  . Atrial fibrillation (HCC)   . COPD (chronic obstructive pulmonary disease) (HCC)     PAST SURGICAL HISTORY:  None  SOCIAL HISTORY:   Social History  Substance Use Topics  . Smoking status: Current Every Day Smoker  . Smokeless tobacco: Not on file  . Alcohol Use: No    FAMILY HISTORY:  Positive for CAD  DRUG ALLERGIES:  No Known Allergies  REVIEW OF SYSTEMS:   Review of Systems  Constitutional: Negative for fever, chills and malaise/fatigue.  HENT: Negative for ear discharge, ear pain, hearing loss, nosebleeds and sore throat.   Eyes: Negative for blurred vision and pain.  Respiratory: Positive for shortness of breath. Negative for cough, hemoptysis and wheezing.   Cardiovascular: Negative for chest pain, palpitations  and leg swelling.  Gastrointestinal: Negative for nausea, vomiting, abdominal pain, diarrhea and blood in stool.  Genitourinary: Negative for dysuria.  Musculoskeletal: Negative for back pain.  Neurological: Positive for weakness. Negative for dizziness, tremors, speech change, focal weakness, seizures and headaches.  Endo/Heme/Allergies: Does not bruise/bleed easily.  Psychiatric/Behavioral: Negative for depression, suicidal ideas and hallucinations.    MEDICATIONS AT HOME:   Prior to Admission medications   Medication Sig Start Date End Date Taking? Authorizing Provider  albuterol (PROVENTIL HFA;VENTOLIN HFA) 108 (90 Base) MCG/ACT inhaler Inhale 2 puffs into the lungs every 6 (six) hours as needed for wheezing or shortness of breath.   Yes Historical Provider, MD  ALPRAZolam Prudy Feeler) 0.25 MG tablet Take 0.25 mg by mouth at bedtime as needed for sleep.   Yes Historical Provider, MD  celecoxib (CELEBREX) 200 MG capsule Take 200 mg by mouth 2 (two) times daily.   Yes Historical Provider, MD  diclofenac sodium (VOLTAREN) 1 % GEL Apply 2 g topically 4 (four) times daily as needed (for pain).   Yes Historical Provider, MD  Fluticasone-Salmeterol (ADVAIR) 500-50 MCG/DOSE AEPB Inhale 1 puff into the lungs 2 (two) times daily.   Yes Historical Provider, MD  ipratropium-albuterol (DUONEB) 0.5-2.5 (3) MG/3ML SOLN Take 3 mLs by nebulization every 4 (four) hours as needed (for wheezing/shortness of breath).   Yes Historical Provider, MD  metoprolol succinate (TOPROL-XL) 50 MG 24 hr tablet Take 50 mg by mouth daily. Take with or immediately following a meal.   Yes Historical  Provider, MD  mirtazapine (REMERON) 7.5 MG tablet Take 7.5 mg by mouth at bedtime.   Yes Historical Provider, MD  Polyethyl Glycol-Propyl Glycol (SYSTANE) 0.4-0.3 % SOLN Apply to eye.   Yes Historical Provider, MD  predniSONE (DELTASONE) 10 MG tablet Take 10 mg by mouth as directed. Take 6 tabs daily for 4 days, then 4 tabs daily for 4  days, then 2 tabs daily for 4 days.   Yes Historical Provider, MD  roflumilast (DALIRESP) 500 MCG TABS tablet Take 500 mcg by mouth daily.   Yes Historical Provider, MD  Tiotropium Bromide Monohydrate (SPIRIVA RESPIMAT) 2.5 MCG/ACT AERS Inhale 1 puff into the lungs daily.   Yes Historical Provider, MD      VITAL SIGNS:  Blood pressure 95/60, pulse 113, temperature 99.7 F (37.6 C), temperature source Oral, resp. rate 23, height 5\' 9"  (1.753 m), weight 58.968 kg (130 lb), SpO2 96 %.  PHYSICAL EXAMINATION:   Physical Exam  Constitutional: He is oriented to person, place, and time and well-developed, well-nourished, and in no distress. No distress.  Frail appearing wearing BiPAP  HENT:  Head: Normocephalic.  Eyes: No scleral icterus.  Neck: Normal range of motion. Neck supple. No JVD present. No tracheal deviation present.  Cardiovascular: Normal rate, regular rhythm and normal heart sounds.  Exam reveals no gallop and no friction rub.   No murmur heard. Pulmonary/Chest: Effort normal and breath sounds normal. No respiratory distress. He has no wheezes. He has no rales. He exhibits no tenderness.  Abdominal: Soft. Bowel sounds are normal. He exhibits no distension and no mass. There is no tenderness. There is no rebound and no guarding.  Musculoskeletal: Normal range of motion. He exhibits no edema.  Neurological: He is alert and oriented to person, place, and time.  Skin: Skin is warm. No rash noted. No erythema.  Psychiatric: Affect and judgment normal.      LABORATORY PANEL:   CBC  Recent Labs Lab 02/21/16 1039  WBC 36.3*  HGB 13.1  HCT 39.7*  PLT 358   ------------------------------------------------------------------------------------------------------------------  Chemistries   Recent Labs Lab 02/21/16 1039  NA 133*  K 3.9  CL 98*  CO2 23  GLUCOSE 134*  BUN 24*  CREATININE 0.82  CALCIUM 8.6*    ------------------------------------------------------------------------------------------------------------------  Cardiac Enzymes  Recent Labs Lab 02/21/16 1039  TROPONINI 0.04*   ------------------------------------------------------------------------------------------------------------------  RADIOLOGY:  Dg Chest 2 View  02/21/2016  CLINICAL DATA:  Shortness of Breath EXAM: CHEST  2 VIEW COMPARISON:  January 03, 2016 chest radiograph and chest CT June 08, 2015 FINDINGS: Lungs remain hyperexpanded. There is a new area of airspace consolidation in the posterior segment of the left upper lobe. There is also extensive upper lobe scarring bilaterally, more severe on the left than on the right, not appreciably changed. Nodular opacities bilaterally, more notable in the right mid and upper lung zones, remain without change. No new opacity is evident by radiography. The heart size is normal. 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 No adenopathy is evident. No bone lesions. IMPRESSION: There is increased opacity in the posterior segment of the left upper lobe compared to prior studies. This appearance is most likely due to pneumonia superimposed on parenchymal lung scarring. Elsewhere, lung scarring and nodular opacities remain stable. There is no change in cardiac silhouette. Followup PA and lateral chest radiographs recommended in 3-4 weeks following trial of antibiotic therapy to ensure resolution and exclude underlying malignancy. Electronically Signed   By: Chrissie NoaWilliam  Margarita Grizzle III M.D.   On: 02/21/2016 11:19    EKG:   Sinus tachycardia heart rate 140s no ST elevation or depression  IMPRESSION AND PLAN:   70 year old male with a history of MAI not completely treated, tobacco dependence and COPD who presents with shortness of breath and found to have community-acquired pneumonia.  1. Acute hypoxic respiratory failure: This is due to  pneumonia. BiPAP will be weaned off. Antibiotics for community-acquired pneumonia.  2. Community-acquired pneumonia: Patient is on chronic steroids  and therefore I will treat pneumonia with Zosyn and Levaquin. Follow up on blood/sputum culture. Follow up on Dr. Milta Deiters consult. NEEDS outpatient CXR in 4 weeks to r/o lung mass 3. COPD Patient is currently on steroid taper prescribed by his pulmonologist on Monday. Continue with steroid taper.  Dr Welton Flakes is attempting to decrease dose of his chronic steroids from 20 mg to 5 mg. Continue inhalers.  4. PAF: Heart rate better controlled and in NSR.  Continue Metoprolol.  5. Tobacco dependence: Encouraged to stop smoking and counseled for 3 minutes Patch requested.  6. Elevated WBC from PNA and steroids. Repeat in am  7. Hx MAI: Patient discontinued treatment after 9 months, noncompliance  8. Hyponatremia: from poop po intake and PNA. Repeat in am IVF    D/w Dr Welton Flakes  All the records are reviewed and case discussed with ED provider. Management plans discussed with the patient and he in agreement  CODE STATUS: FULL  TOTAL TIME TAKING CARE OF THIS PATIENT: 55 minutes.    Liylah Najarro M.D on 02/21/2016 at 2:02 PM  Between 7am to 6pm - Pager - [phone]  After 6pm go to [email or web address] - Social research officer, government  [facility]  Office  [phone]  CC: Primary care physician; Lyndon Code, MD

## 2016-02-21 NOTE — ED Notes (Signed)
RT Lea contacted, he is in code and will put pt on BIPaP when able

## 2016-02-21 NOTE — Plan of Care (Signed)
Problem: Pain Managment: Goal: General experience of comfort will improve Outcome: Progressing 0 pain

## 2016-02-21 NOTE — ED Notes (Signed)
Person whith pt says he goes in and out of atrial fib with rapid hr at times.  This has been going on for months.  Says pt was worse yesterday with short of breath and his left upper chest is hurting.  Pt not talking much to answer, but he is alert.

## 2016-02-21 NOTE — ED Notes (Addendum)
Pt taken off BiPAP and placed on 3L [place] per MD Sharma CovertNorman.  Pt tolerating well.

## 2016-02-21 NOTE — Progress Notes (Signed)
Pharmacy Antibiotic Note  Jeremy SantiagoDennis M Otani is a 70 y.o. male admitted on 02/21/2016 with pneumonia.  Pharmacy has been consulted for levofloxacin & piperacillin/tazobactam dosing.  Plan: Piperacillin/tazobactam 3.375 g IV q8h EI Levofloxacin 750 mg IV q24h (starting tomorrow since patient already received azithromycin today and they need to be separated by 24 hours)  Height: 5\' 9"  (175.3 cm) Weight: 130 lb (58.968 kg) IBW/kg (Calculated) : 70.7  Temp (24hrs), Avg:99.7 F (37.6 C), Min:99.7 F (37.6 C), Max:99.7 F (37.6 C)   Recent Labs Lab 02/21/16 1039 02/21/16 1228  WBC 36.3*  --   CREATININE 0.82  --   LATICACIDVEN  --  0.8    Estimated Creatinine Clearance: 72 mL/min (by C-G formula based on Cr of 0.82).    No Known Allergies  Antimicrobials this admission: levofloxacin 6/15 >>  Piperacillin/tazobactam 6/14 >>  CTX and azithromycin one time doses in ED 6/14  Dose adjustments this admission:  Microbiology results: 6/14 BCx: Sent 6/14 UCx: Sent   Thank you for allowing pharmacy to be a part of this patient's care.  Cindi CarbonMary M Nehemie Casserly, PharmD Clinical Pharmacist 02/21/2016 4:02 PM

## 2016-02-21 NOTE — ED Notes (Signed)
Troponin 0.04 Dr.lord notified

## 2016-02-21 NOTE — ED Provider Notes (Signed)
[facility] Emergency Department Provider Note  ____________________________________________  Time seen: Approximately 11:57 AM  I have reviewed the triage vital signs and the nursing notes.   HISTORY  Chief Complaint Tachycardia; Chest Pain; and Shortness of Breath    HPI Jeremy Fields is a 70 y.o. male with a history of A. fib and COPD, remote history of pneumothorax, MAI currently not under treatment, presenting with shortness of breath. The patient reports that for months she has had a progressively worsening shortness of breath, but over the last 2 days it has been significant worse. He is now feeling short of breath when he is just lying in bed. He does not have any chest pain, pressure or tightness, but he does have a sensation of palpitations. No cough, congestion or rhinorrhea, ear pain or sore throat. No fevers or chills. No lower extremity swelling. Patient has bilateral calf pain which has been going on for over a year, and is not new in character or severity.   Past Medical History  Diagnosis Date  . Atrial fibrillation (HCC)   . COPD (chronic obstructive pulmonary disease) [facility])     Patient Active Problem List   Diagnosis Date Noted  . Pneumonia 02/21/2016    No past surgical history on file.  Current Outpatient Rx  Name  Route  Sig  Dispense  Refill  . albuterol (PROVENTIL HFA;VENTOLIN HFA) 108 (90 Base) MCG/ACT inhaler   Inhalation   Inhale 2 puffs into the lungs every 6 (six) hours as needed for wheezing or shortness of breath.         . ALPRAZolam (XANAX) 0.25 MG tablet   Oral   Take 0.25 mg by mouth at bedtime as needed for sleep.         . celecoxib (CELEBREX) 200 MG capsule   Oral   Take 200 mg by mouth 2 (two) times daily.         . Fluticasone-Salmeterol (ADVAIR) 500-50 MCG/DOSE AEPB   Inhalation   Inhale 1 puff into the lungs 2 (two) times daily.         Marland Kitchen ipratropium-albuterol (DUONEB) 0.5-2.5 (3) MG/3ML SOLN    Nebulization   Take 3 mLs by nebulization every 4 (four) hours as needed (for wheezing/shortness of breath).         . metoprolol succinate (TOPROL-XL) 50 MG 24 hr tablet   Oral   Take 50 mg by mouth daily. Take with or immediately following a meal.         . Polyethyl Glycol-Propyl Glycol (SYSTANE) 0.4-0.3 % SOLN   Ophthalmic   Apply to eye.         . predniSONE (DELTASONE) 10 MG tablet   Oral   Take 10 mg by mouth as directed. Take 6 tabs daily for 4 days, then 4 tabs daily for 4 days, then 2 tabs daily for 4 days.           Allergies Review of patient's allergies indicates no known allergies.  No family history on file.  Social History Social History  Substance Use Topics  . Smoking status: Current Every Day Smoker  . Smokeless tobacco: None  . Alcohol Use: No    Review of Systems Constitutional: No fever/chills. Eyes: No visual changes.No eye discharge. ENT: No sore throat. No congestion or rhinorrhea. Cardiovascular: Denies chest pain. Positive palpitations. Respiratory: Positive shortness of breath.  No cough. Gastrointestinal: No abdominal pain.  No nausea, no vomiting.  No diarrhea.  No constipation. Genitourinary: Negative for dysuria. Musculoskeletal: Negative for back pain. Skin: Negative for rash. Neurological: Negative for headaches. No focal numbness, tingling or weakness.   10-point ROS otherwise negative.  ____________________________________________   PHYSICAL EXAM:  VITAL SIGNS: ED Triage Vitals  Enc Vitals Group     BP 02/21/16 1031 107/71 mmHg     Pulse Rate 02/21/16 1031 144     Resp 02/21/16 1031 18     Temp 02/21/16 1031 99.7 F (37.6 C)     Temp Source 02/21/16 1031 Oral     SpO2 02/21/16 1031 93 %     Weight 02/21/16 1031 130 lb (58.968 kg)     Height 02/21/16 1031 5\' 9"  (1.753 m)     Head Cir --      Peak Flow --      Pain Score 02/21/16 1035 2     Pain Loc --      Pain Edu? --      Excl. in GC? --      Constitutional: Alert and oriented. Chronically ill-appearing, mildly uncomfortable, nontoxic. Answers questions appropriately. Eyes: Conjunctivae are normal.  EOMI. No scleral icterus. No eye discharge. Head: Atraumatic. Nose: No congestion/rhinnorhea. Mouth/Throat: Mucous membranes are dry.  Neck: No stridor.  Supple.  No JVD. No meningismus. Cardiovascular: Fast rate, regular rhythm. No murmurs, rubs or gallops.  Respiratory: Tachypneic with mild accessory muscle use, no retractions. Lungs are clear to auscultation bilaterally with a prolonged expiratory phase. Gastrointestinal: Soft, nontender and nondistended.  No guarding or rebound.  No peritoneal signs. Musculoskeletal: No LE edema. Bilateral symmetric tenderness to palpation in the calves without palpable cords.  Negative Homan's sign. Neurologic:  A&Ox3.  Speech is clear.  Face and smile are symmetric.  EOMI.  Moves all extremities well. Skin:  Skin is warm, dry and intact. No rash noted. Psychiatric: Mood and affect are normal. Speech and behavior are normal.  Normal judgement.  ____________________________________________   LABS (all labs ordered are listed, but only abnormal results are displayed)  Labs Reviewed  BASIC METABOLIC PANEL - Abnormal; Notable for the following:    Sodium 133 (*)    Chloride 98 (*)    Glucose, Bld 134 (*)    BUN 24 (*)    Calcium 8.6 (*)    All other components within normal limits  CBC - Abnormal; Notable for the following:    WBC 36.3 (*)    HCT 39.7 (*)    RDW 17.7 (*)    All other components within normal limits  TROPONIN I - Abnormal; Notable for the following:    Troponin I 0.04 (*)    All other components within normal limits  BLOOD GAS, VENOUS - Abnormal; Notable for the following:    pH, Ven 7.49 (*)    pCO2, Ven 37 (*)    pO2, Ven 63.0 (*)    Bicarbonate 28.2 (*)    Acid-Base Excess 4.7 (*)    All other components within normal limits  URINALYSIS COMPLETEWITH  MICROSCOPIC ([facility] ONLY) - Abnormal; Notable for the following:    Color, Urine YELLOW (*)    APPearance CLEAR (*)    Protein, ur 30 (*)    Squamous Epithelial / LPF 0-5 (*)    All other components within normal limits  CULTURE, BLOOD (ROUTINE X 2)  CULTURE, BLOOD (ROUTINE X 2)  URINE CULTURE  CULTURE, EXPECTORATED SPUTUM-ASSESSMENT  LACTIC ACID, PLASMA  TROPONIN I  TROPONIN I  TROPONIN I  BASIC METABOLIC  PANEL  CBC   ____________________________________________  EKG  ED ECG REPORT I, Rockne Menghini, the attending physician, personally viewed and interpreted this ECG.   Date: 02/21/2016  EKG Time: 1031  Rate: 141  Rhythm: sinus tachycardia  Axis: Normal  Intervals:none  ST&T Change: No ST elevation.  ____________________________________________  RADIOLOGY  Dg Chest 2 View  02/21/2016  CLINICAL DATA:  Shortness of Breath EXAM: CHEST  2 VIEW COMPARISON:  January 03, 2016 chest radiograph and chest CT June 08, 2015 FINDINGS: Lungs remain hyperexpanded. There is a new area of airspace consolidation in the posterior segment of the left upper lobe. There is also extensive upper lobe scarring bilaterally, more severe on the left than on the right, not appreciably changed. Nodular opacities bilaterally, more notable in the right mid and upper lung zones, remain without change. No new opacity is evident by radiography. The heart size is normal. 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 No adenopathy is evident. No bone lesions. IMPRESSION: There is increased opacity in the posterior segment of the left upper lobe compared to prior studies. This appearance is most likely due to pneumonia superimposed on parenchymal lung scarring. Elsewhere, lung scarring and nodular opacities remain stable. There is no change in cardiac silhouette. Followup PA and lateral chest radiographs recommended in 3-4 weeks following trial of antibiotic  therapy to ensure resolution and exclude underlying malignancy. Electronically Signed   By: Bretta Bang III M.D.   On: 02/21/2016 11:19    ____________________________________________   PROCEDURES  Procedure(s) performed: None  Critical Care performed: Yes, see critical care note(s) ____________________________________________   INITIAL IMPRESSION / ASSESSMENT AND PLAN / ED COURSE  Pertinent labs & imaging results that were available during my care of the patient were reviewed by me and considered in my medical decision making (see chart for details).  70 y.o. male with a history of COPD presenting with 2 days of acute on chronic worsening shortness of breath, now occurring even at rest. The patient has not had any chest pain but does have occasional palpitations. The patient has vital signs taken from his home this morning that show hypotension with a blood pressure in the 90s, tachycardia in the 130s, and a pulse oximeter reading of 93% on room air which is reported as his baseline. Here, the patient has a temperature of 99.7 but no true fever, a pulse rate of 144 and a blood pressure that is 107/71. I'm concerned about sepsis in this patient given that he has a new focal infiltrate on his chest x-ray, white blood cell count of 36. However he has not been having any fevers or cough at home. The patient does have a troponin of 0.04 which may be from demand ischemia given his significant tachycardia and underlying pulmonary process. He will need admission for treatment of his infection and continued cardiac monitoring. At this time, I'm placing the patient on BiPAP and initiating immediate antibiotics for sepsis.  CRITICAL CARE Performed by: Rockne Menghini   Total critical care time: 40 minutes  Critical care time was exclusive of separately billable procedures and treating other patients.  Critical care was necessary to treat or prevent imminent or life-threatening  deterioration.  Critical care was time spent personally by me on the following activities: development of treatment plan with patient and/or surrogate as well as nursing, discussions with consultants, evaluation of patient's response to treatment, examination of patient, obtaining history from patient or surrogate, ordering and performing treatments and  interventions, ordering and review of laboratory studies, ordering and review of radiographic studies, pulse oximetry and re-evaluation of patient's condition.   ____________________________________________  FINAL CLINICAL IMPRESSION(S) / ED DIAGNOSES  Final diagnoses:  Sepsis, due to unspecified organism (HCC)  CAP (community acquired pneumonia)  Sinus tachycardia (HCC)  Elevated troponin      NEW MEDICATIONS STARTED DURING THIS VISIT:  New Prescriptions   No medications on file     Rockne Menghini, MD 02/21/16 1710

## 2016-02-22 LAB — CBC
HCT: 34 % — ABNORMAL LOW (ref 40.0–52.0)
HEMOGLOBIN: 11.1 g/dL — AB (ref 13.0–18.0)
MCH: 28.5 pg (ref 26.0–34.0)
MCHC: 32.5 g/dL (ref 32.0–36.0)
MCV: 87.7 fL (ref 80.0–100.0)
Platelets: 293 10*3/uL (ref 150–440)
RBC: 3.88 MIL/uL — AB (ref 4.40–5.90)
RDW: 17.7 % — ABNORMAL HIGH (ref 11.5–14.5)
WBC: 26.6 10*3/uL — ABNORMAL HIGH (ref 3.8–10.6)

## 2016-02-22 LAB — BASIC METABOLIC PANEL
ANION GAP: 7 (ref 5–15)
BUN: 20 mg/dL (ref 6–20)
CALCIUM: 8 mg/dL — AB (ref 8.9–10.3)
CHLORIDE: 101 mmol/L (ref 101–111)
CO2: 27 mmol/L (ref 22–32)
CREATININE: 0.79 mg/dL (ref 0.61–1.24)
GFR calc Af Amer: >60 mL/min (ref >60)
GFR calc non Af Amer: >60 mL/min (ref >60)
GLUCOSE: 81 mg/dL (ref 65–99)
Potassium: 3.7 mmol/L (ref 3.5–5.1)
Sodium: 135 mmol/L (ref 135–145)

## 2016-02-22 LAB — URINE CULTURE: CULTURE: NO GROWTH

## 2016-02-22 LAB — TROPONIN I: Troponin I: <0.03 ng/mL (ref <0.031)

## 2016-02-22 MED ORDER — DILTIAZEM HCL 100 MG IV SOLR
5.0000 mg/h | INTRAVENOUS | Status: DC
  Administered 2016-02-23: 5 mg/h via INTRAVENOUS
  Filled 2016-02-22: qty 100

## 2016-02-22 MED ORDER — GUAIFENESIN 100 MG/5ML PO SOLN: 5.0000 mL | ORAL | Status: DC | PRN

## 2016-02-22 MED ORDER — DILTIAZEM HCL 25 MG/5ML IV SOLN
10.0000 mg | Freq: Once | INTRAVENOUS | Status: AC
  Administered 2016-02-22: 10 mg via INTRAVENOUS
  Filled 2016-02-22: qty 5

## 2016-02-22 MED ORDER — DIGOXIN 0.25 MG/ML IJ SOLN
0.2500 mg | Freq: Once | INTRAMUSCULAR | Status: AC
  Administered 2016-02-22: 0.25 mg via INTRAVENOUS
  Filled 2016-02-22: qty 1

## 2016-02-22 MED ORDER — DEXTROSE 5 % IV SOLN
500.0000 mg | INTRAVENOUS | Status: DC
  Administered 2016-02-23: 500 mg via INTRAVENOUS
  Filled 2016-02-22 (×2): qty 500

## 2016-02-22 MED ORDER — SODIUM CHLORIDE 0.9 % IV BOLUS (SEPSIS)
1000.0000 mL | INTRAVENOUS | Status: DC | PRN
  Administered 2016-02-22: 1000 mL via INTRAVENOUS
  Filled 2016-02-22: qty 1000

## 2016-02-22 NOTE — Progress Notes (Signed)
[facility] - [place] at [facility]   PATIENT NAME: Jeremy Fields    MR#:  [id number]  DATE OF BIRTH:  1946/02/25  SUBJECTIVE:  CHIEF COMPLAINT:   Chief Complaint  Patient presents with  . Tachycardia  . Chest Pain  . Shortness of Breath   Cough, SOB, weak, poor appetite. Hypotension (BP 88/47) and tachycardia at 120's this am. REVIEW OF SYSTEMS:  CONSTITUTIONAL: No fever, has generalized weakness.  EYES: No blurred or double vision.  EARS, NOSE, AND THROAT: No tinnitus or ear pain.  RESPIRATORY: has cough, shortness of breath, no wheezing or hemoptysis.  CARDIOVASCULAR: No chest pain, orthopnea, edema.  GASTROINTESTINAL: No nausea, vomiting, diarrhea or abdominal pain.  GENITOURINARY: No dysuria, hematuria.  ENDOCRINE: No polyuria, nocturia,  HEMATOLOGY: No anemia, easy bruising or bleeding SKIN: No rash or lesion. MUSCULOSKELETAL: No joint pain or arthritis.   NEUROLOGIC: No tingling, numbness, weakness.  PSYCHIATRY: No anxiety or depression.   DRUG ALLERGIES:  No Known Allergies  VITALS:  Blood pressure 97/60, pulse 105, temperature 98.3 F (36.8 C), temperature source Oral, resp. rate 16, height  (1.753 m), weight 131 lb 6 oz (59.591 kg), SpO2 99 %.  PHYSICAL EXAMINATION:  GENERAL:  70 y.o.-year-old patient lying in the bed with no acute distress. Thin. EYES: Pupils equal, round, reactive to light and accommodation. No scleral icterus. Extraocular muscles intact.  HEENT: Head atraumatic, normocephalic. Oropharynx and nasopharynx clear.  NECK:  Supple, no jugular venous distention. No thyroid enlargement, no tenderness.  LUNGS: Normal breath sounds bilaterally, no wheezing, rales,rhonchi or crepitation. No use of accessory muscles of respiration.  CARDIOVASCULAR: S1, S2 normal. No murmurs, rubs, or gallops.  ABDOMEN: Soft, nontender, nondistended. Bowel sounds present. No organomegaly or mass.  EXTREMITIES: No pedal edema, cyanosis, or  clubbing.  NEUROLOGIC: Cranial nerves II through XII are intact. Muscle strength 5/5 in all extremities. Sensation intact. Gait not checked.  PSYCHIATRIC: The patient is alert and oriented x 3.  SKIN: No obvious rash, lesion, or ulcer.    LABORATORY PANEL:   CBC  Recent Labs Lab 02/22/16 0259  WBC 26.6*  HGB 11.1*  HCT 34.0*  PLT 293   ------------------------------------------------------------------------------------------------------------------  Chemistries   Recent Labs Lab 02/22/16 0259  NA 135  K 3.7  CL 101  CO2 27  GLUCOSE 81  BUN 20  CREATININE 0.79  CALCIUM 8.0*   ------------------------------------------------------------------------------------------------------------------  Cardiac Enzymes  Recent Labs Lab 02/22/16 0259  TROPONINI <0.03   ------------------------------------------------------------------------------------------------------------------  RADIOLOGY:  Dg Chest 2 View  02/21/2016  CLINICAL DATA:  Shortness of Breath EXAM: CHEST  2 VIEW COMPARISON:  January 03, 2016 chest radiograph and chest CT June 08, 2015 FINDINGS: Lungs remain hyperexpanded. There is a new area of airspace consolidation in the posterior segment of the left upper lobe. There is also extensive upper lobe scarring bilaterally, more severe on the left than on the right, not appreciably changed. Nodular opacities bilaterally, more notable in the right mid and upper lung zones, remain without change. No new opacity is evident by radiography. The heart size is normal. 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 No adenopathy is evident. No bone lesions. IMPRESSION: There is increased opacity in the posterior segment of the left upper lobe compared to prior studies. This appearance is most likely due to pneumonia superimposed on parenchymal lung scarring. Elsewhere, lung scarring and nodular opacities remain stable. There is no  change in cardiac silhouette. Followup PA and lateral chest  radiographs recommended in 3-4 weeks following trial of antibiotic therapy to ensure resolution and exclude underlying malignancy. Electronically Signed   By: Bretta BangWilliam  Woodruff III M.D.   On: 02/21/2016 11:19    EKG:   Orders placed or performed during the hospital encounter of 02/21/16  . ED EKG within 10 minutes  . 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  1. Acute hypoxic respiratory failure: This is due to pneumonia. BiPAP was weaned off. On O2 [place] 4 L. Antibiotics for community-acquired pneumonia.  2. Community-acquired pneumonia and sepsis (hypotension, tachycardia and leukocytosis, though Patient is on chronic steroids)  Continue Zosyn and Levaquin. NS iv. And bolus prn. Follow up blood/sputum culture. Continue abx per Dr. Milta DeitersKhan's consult. NEEDS outpatient CXR in 4 weeks to r/o lung mass/scars.  3. COPD. Stable. Patient is currently on steroid taper prescribed by his pulmonologist on Monday. Continue with steroid taper.  Dr Welton FlakesKhan is attempting to decrease dose of his chronic steroids from 20 mg to 5 mg. Continue inhalers.  4. PAF: Heart rate better controlled and in NSR.  Continue Metoprolol.  5. Tobacco dependence: Encouraged to stop smoking and counseled, on nicotine Patch.  6. Elevated WBC from PNA and steroids. Improving.  7. Hx MAI: Patient discontinued treatment after 9 months, noncompliance. May resume treatment after discharge per Dr. Welton FlakesKhan.  8. Hyponatremia: from poop po intake and PNA. Improved.  D/w Dr Welton FlakesKhan   All the records are reviewed and case discussed with Care Management/Social Workerr. Management plans discussed with the patient, her daughgter and they are in agreement.  CODE STATUS: full code.  TOTAL  TIME TAKING CARE OF THIS PATIENT: 45 minutes.  Greater than 50% time was spent on coordination of care and face-to-face counseling.  POSSIBLE D/C IN 3 DAYS, DEPENDING ON CLINICAL CONDITION.   Shaune Pollackhen, Annalena Piatt M.D on 02/22/2016 at 12:35 PM  Between 7am to 6pm - Pager - [phone]  After 6pm go to [email or web address] - password [email or web address] [facility]  [place]  Office  [phone]  CC: Primary care physician; Lyndon CodeKHAN, FOZIA M, MD

## 2016-02-22 NOTE — Care Management Note (Signed)
Case Management Note  Patient Details  Name: Jeremy Fields MRN: [id number] Date of Birth: [birth year]  Subjective/Objective:                  Met with patient to discuss discharge planning. He is new to O2 and apparently has a new lung mass- I am unsure if patient knows this information. He seemed depressed and had very little to say during my assessment however he did answer yes and no. He states he feels bad. He lives alone. He is usually independent with mobility/drives. His pulmonologist is Dr. Humphrey Rolls. He has a nebulizer but doesn't have any meds to go in it.   Action/Plan: [facility] to [facility] for potential O2 need. List of home health agencies left with patient. RNCM will continue to follow.   Expected Discharge Date:                  Expected Discharge Plan:     In-House Referral:     Discharge planning Services  CM Consult  Post Acute Care Choice:  Home Health, Durable Medical Equipment Choice offered to:  Patient  DME Arranged:  Oxygen DME Agency:  [place]:    [place] Agency:     Status of Service:  In process, will continue to follow  Medicare Important Message Given:    Date Medicare IM Given:    Medicare IM give by:    Date Additional Medicare IM Given:    Additional Medicare Important Message give by:     If discussed at [place] of Stay Meetings, dates discussed:    Additional Comments:  Marshell Garfinkel, RN 02/22/2016, 9:37 AM

## 2016-02-22 NOTE — Care Management (Addendum)
Daughter wanting more discussion with MD regarding patient status.  It is unclear if CT scan was discuss with patient or family.  Text Dr. Imogene Burnhen to update of this request.

## 2016-02-22 NOTE — Progress Notes (Signed)
Pts. Heart rate is fluctuating from 120's to 140's. Dr. Lendell CapriceSullivan notified and ordered digoxin 0.25mg  IV once.

## 2016-02-22 NOTE — Progress Notes (Signed)
D: This RN was notified of patient's low BP.  A: Re-checked BP 97/48, pulse 123. Notified MD. R: New orders from MD to discontinue all BP meds.

## 2016-02-22 NOTE — Progress Notes (Signed)
This Rn was notified that patient ran a 13 beat SVT.  MD notified No new orders at this time.

## 2016-02-22 NOTE — Progress Notes (Signed)
CH provided pastoral presence. Pt anticipating arrival of daughter; expressed appreciation.

## 2016-02-22 NOTE — Progress Notes (Signed)
Dr. Lendell CapriceSullivan in to see pt. Ordered cardizem 10mg  IV and transfer off unit for cardizem drip.

## 2016-02-22 NOTE — Significant Event (Signed)
Rapid Response Event Note  Overview: Time Called: 2254 Arrival Time: 2256 Event Type: Cardiac  Initial Focused Assessment: Pt lying in bed no distress. Denies chest pain or SOB.  Pt had elevated heart rate that did not respond to Digoxin   Interventions: MD Lendell CapriceSullivan to bedside, 12 lead EKG ordered and done.  Plan of Care (if not transferred): To go to 2A for cardizem drip  Event Summary: Name of Physician Notified: Lendell CapriceSullivan at 2305    at    Outcome: Transferred (Comment)  Event End Time: 2320  Jyll Tomaro A

## 2016-02-22 NOTE — Progress Notes (Signed)
Pts. Heart rate elevated to 160's. Prime doc paged x2 with no response. Rapid response called.

## 2016-02-22 NOTE — Consult Note (Signed)
Pulmonary Critical Care  Initial Consult Note   Jeremy Fields Jeremy Fields:[id number] DOB: 1946/03/03 DOA: 02/21/2016  Referring physician: Adrian Saran, MD PCP: Lyndon Code, MD   Chief Complaint: Increased SOB  HPI: Jeremy Fields is a 70 y.o. male with prior history of MAI COPD presented to the office with increased SOB. He was started on a steroid dose pack. He states he did not seem to get better and so came into the hospital. In the ED he had a CXR done which shows possible upper lobe infiltrate. He has chronic lung disease and so there is also some superimposed scarring noted. As far as his MAI is concerned he had stopped treatment and this had been discussed with him in the office. He did not want to restart therapy., Patient has no fevers no chills no chest pain. He states he has no night sweats noted. Denies hemoptysis   Review of Systems:  Constitutional:  No weight loss, night sweats, Fevers, chills, fatigue.  HEENT:  No headaches, nasal congestion, post nasal drip,  Cardio-vascular:  No chest pain, anasarca, dizziness, palpitations  GI:  No heartburn, indigestion, abdominal pain, nausea, vomiting, diarrhea  Resp:  +shortness of breath with exertion and at rest. +cough, No coughing up of blood. +wheezing Skin:  no rash or lesions.  Musculoskeletal:  No joint pain or swelling.   Remainder ROS performed and is unremarkable other than noted in HPI  Past Medical History  Diagnosis Date  . Atrial fibrillation (HCC)   . COPD (chronic obstructive pulmonary disease) (HCC)    No past surgical history on file. Social History:  reports that he has been smoking.  He does not have any smokeless tobacco history on file. He reports that he does not drink alcohol. His drug history is not on file.  No Known Allergies  No family history on file.  Prior to Admission medications   Medication Sig Start Date End Date Taking? Authorizing Provider  albuterol (PROVENTIL HFA;VENTOLIN HFA) 108 (90  Base) MCG/ACT inhaler Inhale 2 puffs into the lungs every 6 (six) hours as needed for wheezing or shortness of breath.   Yes Historical Provider, MD  ALPRAZolam Prudy Feeler) 0.25 MG tablet Take 0.25 mg by mouth at bedtime as needed for sleep.   Yes Historical Provider, MD  celecoxib (CELEBREX) 200 MG capsule Take 200 mg by mouth 2 (two) times daily.   Yes Historical Provider, MD  Fluticasone-Salmeterol (ADVAIR) 500-50 MCG/DOSE AEPB Inhale 1 puff into the lungs 2 (two) times daily.   Yes Historical Provider, MD  ipratropium-albuterol (DUONEB) 0.5-2.5 (3) MG/3ML SOLN Take 3 mLs by nebulization every 4 (four) hours as needed (for wheezing/shortness of breath).   Yes Historical Provider, MD  metoprolol succinate (TOPROL-XL) 50 MG 24 hr tablet Take 50 mg by mouth daily. Take with or immediately following a meal.   Yes Historical Provider, MD  Polyethyl Glycol-Propyl Glycol (SYSTANE) 0.4-0.3 % SOLN Apply to eye.   Yes Historical Provider, MD  predniSONE (DELTASONE) 10 MG tablet Take 10 mg by mouth as directed. Take 6 tabs daily for 4 days, then 4 tabs daily for 4 days, then 2 tabs daily for 4 days.   Yes Historical Provider, MD   Physical Exam: Filed Vitals:   02/22/16 0027 02/22/16 0329 02/22/16 0500 02/22/16 0810  BP: 92/53 128/75  88/47  Pulse: 96 104  124  Temp: 98.2 F (36.8 C) 98.4 F (36.9 C)  98.3 F (36.8 C)  TempSrc: Oral Oral  Oral  Resp: Height:      Weight:   59.591 kg (131 lb 6 oz)   SpO2: 96% 100%  99%    Wt Readings from Last 3 Encounters:  02/22/16 59.591 kg (131 lb 6 oz)    General:  Appears calm and comfortable Eyes: PERRL, normal lids, irises & conjunctiva ENT: grossly normal hearing, lips & tongue Neck: no LAD, masses or thyromegaly Cardiovascular: RRR, no m/r/g. No LE edema. Respiratory: Normal respiratory effort. +rhonchi Abdomen: soft, nontender Skin: no rash or induration seen on limited exam Musculoskeletal: grossly normal tone BUE/BLE Psychiatric:  grossly normal mood and affect Neurologic: grossly non-focal.          Labs on Admission:  Basic Metabolic Panel:  Recent Labs Lab 02/21/16 1039 02/22/16 0259  NA 133* 135  K 3.9 3.7  CL 98* 101  CO2 23 27  GLUCOSE 134* 81  BUN 24* 20  CREATININE 0.82 0.79  CALCIUM 8.6* 8.0*   Liver Function Tests: No results for input(s): AST, ALT, ALKPHOS, BILITOT, PROT, ALBUMIN in the last 168 hours. No results for input(s): LIPASE, AMYLASE in the last 168 hours. No results for input(s): AMMONIA in the last 168 hours. CBC:  Recent Labs Lab 02/21/16 1039 02/22/16 0259  WBC 36.3* 26.6*  HGB 13.1 11.1*  HCT 39.7* 34.0*  MCV 86.8 87.7  PLT 358 293   Cardiac Enzymes:  Recent Labs Lab 02/21/16 1039 02/21/16 1541 02/21/16 2105 02/22/16 0259  TROPONINI 0.04* <0.03 <0.03 <0.03    BNP (last 3 results) No results for input(s): BNP in the last 8760 hours.  ProBNP (last 3 results) No results for input(s): PROBNP in the last 8760 hours.  CBG: No results for input(s): GLUCAP in the last 168 hours.  Radiological Exams on Admission: Dg Chest 2 View  02/21/2016  CLINICAL DATA:  Shortness of Breath EXAM: CHEST  2 VIEW COMPARISON:  January 03, 2016 chest radiograph and chest CT June 08, 2015 FINDINGS: Lungs remain hyperexpanded. There is a new area of airspace consolidation in the posterior segment of the left upper lobe. There is also extensive upper lobe scarring bilaterally, more severe on the left than on the right, not appreciably changed. Nodular opacities bilaterally, more notable in the right mid and upper lung zones, remain without change. No new opacity is evident by radiography. The heart size is normal. 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 No adenopathy is evident. No bone lesions. IMPRESSION: There is increased opacity in the posterior segment of the left upper lobe compared to prior studies. This appearance is most  likely due to pneumonia superimposed on parenchymal lung scarring. Elsewhere, lung scarring and nodular opacities remain stable. There is no change in cardiac silhouette. Followup PA and lateral chest radiographs recommended in 3-4 weeks following trial of antibiotic therapy to ensure resolution and exclude underlying malignancy. Electronically Signed   By: Bretta Bang III M.D.   On: 02/21/2016 11:19   April   Current      Assessment/Plan Active Problems:   Pneumonia   1. Acute on chronic Respiratory failure with hypoxia -he uis now off the BIPAP -follow ABG as needed -titrate oxygen as needed  2. Lobar Pneumonia -reviewed older films definitely new infiltrate noted -with history of MAI in the past raises concern for worsening infection -discussed with him possibility of going back on therapy -for now would continue with present broadened management -consider adding MAI coverage if he is willing  3. H/o MAI infection -as noted above -he was incompletely treated due to non-compliance -I still think he may benefit from full course  4. COPD -continue steroids -continue with inhalers   Code Status: full code  Time spent: 60min    I have personally obtained a history, examined the patient, evaluated laboratory and imaging results, formulated the assessment and plan and placed orders.  The Patient requires high complexity decision making for assessment and support.    Yevonne PaxSaadat A Khan, MD [facility] Pulmonary Critical Care Medicine Sleep Medicine

## 2016-02-22 NOTE — Progress Notes (Signed)
Had a discussion with MD referring to chest x-ray and patient's daughter concerns about pain in his legs (for the past year).  This RN suggested a CT of the chest.  MD declined the request, at this time.

## 2016-02-22 NOTE — Progress Notes (Signed)
Called to bed side because patient now in rapid afib. Called earlier in evening and told went into rapid afib but blood pressure soft and given 125 mcg of digoxin. Called back because HR increased to about 170s. EKG showed Afib RVR, BP improved to 128 systolic so given 10mg  IV cardizem and plan to transfer to tele with cardizem drip. No active chest pain at time of rapid, denied short of breath or palpatations. Patient remains in stable condition.

## 2016-02-22 NOTE — Progress Notes (Signed)
Kimberly, pts. Daughter, notified of pt. Being transferred to 2A.

## 2016-02-22 NOTE — Progress Notes (Signed)
Report called to Lexi. Pt. Transferred to room 237

## 2016-02-23 LAB — CBC
HEMATOCRIT: 32.4 % — AB (ref 40.0–52.0)
HEMOGLOBIN: 10.9 g/dL — AB (ref 13.0–18.0)
MCH: 29.2 pg (ref 26.0–34.0)
MCHC: 33.6 g/dL (ref 32.0–36.0)
MCV: 86.9 fL (ref 80.0–100.0)
Platelets: 269 10*3/uL (ref 150–440)
RBC: 3.73 MIL/uL — AB (ref 4.40–5.90)
RDW: 17.1 % — ABNORMAL HIGH (ref 11.5–14.5)
WBC: 25.5 10*3/uL — ABNORMAL HIGH (ref 3.8–10.6)

## 2016-02-23 LAB — BASIC METABOLIC PANEL
ANION GAP: 10 (ref 5–15)
BUN: 20 mg/dL (ref 6–20)
CALCIUM: 8.2 mg/dL — AB (ref 8.9–10.3)
CO2: 20 mmol/L — AB (ref 22–32)
Chloride: 107 mmol/L (ref 101–111)
Creatinine, Ser: 0.74 mg/dL (ref 0.61–1.24)
GLUCOSE: 109 mg/dL — AB (ref 65–99)
POTASSIUM: 3.3 mmol/L — AB (ref 3.5–5.1)
Sodium: 137 mmol/L (ref 135–145)

## 2016-02-23 LAB — MAGNESIUM: MAGNESIUM: 1.8 mg/dL (ref 1.7–2.4)

## 2016-02-23 LAB — TROPONIN I: TROPONIN I: 0.1 ng/mL — AB (ref <0.031)

## 2016-02-23 MED ORDER — DOCUSATE SODIUM 100 MG PO CAPS
100.0000 mg | ORAL_CAPSULE | Freq: Two times a day (BID) | ORAL | Status: DC
  Administered 2016-02-23 – 2016-02-24 (×2): 100 mg via ORAL
  Filled 2016-02-23 (×2): qty 1

## 2016-02-23 MED ORDER — BISACODYL 5 MG PO TBEC: 10.0000 mg | DELAYED_RELEASE_TABLET | Freq: Every day | ORAL | Status: DC | PRN

## 2016-02-23 MED ORDER — POTASSIUM CHLORIDE CRYS ER 20 MEQ PO TBCR
40.0000 meq | EXTENDED_RELEASE_TABLET | Freq: Once | ORAL | Status: AC
  Administered 2016-02-23: 40 meq via ORAL
  Filled 2016-02-23: qty 2

## 2016-02-23 MED ORDER — APIXABAN 5 MG PO TABS
5.0000 mg | ORAL_TABLET | Freq: Two times a day (BID) | ORAL | Status: DC
  Administered 2016-02-23 – 2016-02-24 (×3): 5 mg via ORAL
  Filled 2016-02-23 (×3): qty 1

## 2016-02-23 MED ORDER — AMIODARONE HCL 200 MG PO TABS
200.0000 mg | ORAL_TABLET | Freq: Every day | ORAL | Status: DC
  Administered 2016-02-23 – 2016-02-24 (×2): 200 mg via ORAL
  Filled 2016-02-23 (×2): qty 1

## 2016-02-23 MED ORDER — PREDNISONE 20 MG PO TABS
40.0000 mg | ORAL_TABLET | Freq: Every day | ORAL | Status: DC
  Administered 2016-02-24: 40 mg via ORAL
  Filled 2016-02-23: qty 2

## 2016-02-23 NOTE — Progress Notes (Signed)
Pt's BP was in the low 90's when Cardizem was initiated, MD. Sheryle Hailiamond was made aware and okay per Dr. To administer medication due to pt's Hx of A-fib. Instructions given by doctor to continue to monitor HR and BP.  Pt converted to ST around 0030 and later to NSR, MD made aware.BP started to fluctuate and Cardizem drip was stopped due to low BP. HR is currently NSR with HR in the 80's. Pt' is asymptomatic in r/t low BP, no more c/o pain. MD Sheryle Hailiamond was also notified of new troponin level of 0.10, no new orders received.

## 2016-02-23 NOTE — Progress Notes (Signed)
[facility] - [place] at [facility]   PATIENT NAME: Cathren LaineDennis Jungwirth    MR#:  [id number]  DATE OF BIRTH:  08/06/1946  SUBJECTIVE:  CHIEF COMPLAINT:   Chief Complaint  Patient presents with  . Tachycardia  . Chest Pain  . Shortness of Breath   Better cough, SOB, weak,  Better appetite. On O2NC 2L. Afib RVR last night, was on cardizem drip, back to NSR, off drip. REVIEW OF SYSTEMS:  CONSTITUTIONAL: No fever, has generalized weakness.  EYES: No blurred or double vision.  EARS, NOSE, AND THROAT: No tinnitus or ear pain.  RESPIRATORY: has cough, shortness of breath, no wheezing or hemoptysis.  CARDIOVASCULAR: No chest pain, orthopnea, edema.  GASTROINTESTINAL: No nausea, vomiting, diarrhea or abdominal pain.  GENITOURINARY: No dysuria, hematuria.  ENDOCRINE: No polyuria, nocturia,  HEMATOLOGY: No anemia, easy bruising or bleeding SKIN: No rash or lesion. MUSCULOSKELETAL: No joint pain or arthritis.   NEUROLOGIC: No tingling, numbness, weakness.  PSYCHIATRY: No anxiety or depression.   DRUG ALLERGIES:  No Known Allergies  VITALS:  Blood pressure 99/54, pulse 102, temperature 97.6 F (36.4 C), temperature source Oral, resp. rate 20, height 5\' 9"  (1.753 m), weight 121 lb 14.4 oz (55.293 kg), SpO2 97 %.  PHYSICAL EXAMINATION:  GENERAL:  70 y.o.-year-old patient lying in the bed with no acute distress. Thin. EYES: Pupils equal, round, reactive to light and accommodation. No scleral icterus. Extraocular muscles intact.  HEENT: Head atraumatic, normocephalic. Oropharynx and nasopharynx clear.  NECK:  Supple, no jugular venous distention. No thyroid enlargement, no tenderness.  LUNGS: Normal breath sounds bilaterally, no wheezing, rales,rhonchi or crepitation. No use of accessory muscles of respiration.  CARDIOVASCULAR: S1, S2 normal. No murmurs, rubs, or gallops.  ABDOMEN: Soft, nontender, nondistended. Bowel sounds present. No organomegaly or mass.  EXTREMITIES:  No pedal edema, cyanosis, or clubbing.  NEUROLOGIC: Cranial nerves II through XII are intact. Muscle strength 4/5 in all extremities. Sensation intact. Gait not checked.  PSYCHIATRIC: The patient is alert and oriented x 3.  SKIN: No obvious rash, lesion, or ulcer.    LABORATORY PANEL:   CBC  Recent Labs Lab 02/23/16 0015  WBC 25.5*  HGB 10.9*  HCT 32.4*  PLT 269   ------------------------------------------------------------------------------------------------------------------  Chemistries   Recent Labs Lab 02/23/16 0015  NA 137  K 3.3*  CL 107  CO2 20*  GLUCOSE 109*  BUN 20  CREATININE 0.74  CALCIUM 8.2*  MG 1.8   ------------------------------------------------------------------------------------------------------------------  Cardiac Enzymes  Recent Labs Lab 02/23/16 0015  TROPONINI 0.10*   ------------------------------------------------------------------------------------------------------------------  RADIOLOGY:  No results found.  EKG:   Orders placed or performed during the hospital encounter of 02/21/16  . ED EKG within 10 minutes  . ED EKG within 10 minutes  . EKG 12-Lead  . EKG 12-Lead  . EKG 12-Lead  . EKG 12-Lead  . EKG 12-Lead  . 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  1. Acute hypoxic respiratory failure: This is due to pneumonia. BiPAP was weaned off. On O2 [place] 4 L. Antibiotics for community-acquired pneumonia.  2. Community-acquired pneumonia and sepsis (hypotension, tachycardia and leukocytosis, though Patient is on chronic steroids)  Continue Zosyn and Levaquin. NS iv. And bolus prn. Follow up blood/sputum culture. Continue abx per Dr. Milta DeitersKhan's consult. NEEDS outpatient CXR in 4 weeks to r/o  lung mass/scars.  3. COPD. Stable. Patient is currently on  steroid taper prescribed by his pulmonologist on Monday. Continue with steroid taper.  Dr Welton Flakes is attempting to decrease dose of his chronic steroids from 20 mg to 5 mg. Continue inhalers.  4. PAF with RVR: back to  NSR. Off cardizem drip. Per Dr. Welton Flakes, starting the amiodarone 400 mg by mouth daily and as well as Eliquis 5 mg by mouth twice a day also we'll get an echocardiogram. Continue Metoprolol.  5. Tobacco dependence: Encouraged to stop smoking and counseled, on nicotine Patch.  6. Elevated WBC from PNA and steroids. Improving.  7. Hx MAI: Patient discontinued treatment after 9 months, noncompliance. May resume treatment after discharge per Dr. Welton Flakes.  8. Hyponatremia: from poop po intake and PNA. Improved.  Hypokalemia. Given potassium supplement. Normal magnesium level.  D/w cardiologist Dr. Welton Flakes.   All the records are reviewed and case discussed with Care Management/Social Workerr. Management plans discussed with the patient, her daughgter and they are in agreement.  CODE STATUS: full code.  TOTAL TIME TAKING CARE OF THIS PATIENT: 42 minutes.  Greater than 50% time was spent on coordination of care and face-to-face counseling.  POSSIBLE D/C IN 2 DAYS, DEPENDING ON CLINICAL CONDITION.   Shaune Pollack M.D on 02/23/2016 at 4:05 PM  Between 7am to 6pm - Pager - [phone]  After 6pm go to [email or web address] - password [email or web address]  [place] [facility]  Office  [phone]  CC: Primary care physician; Lyndon Code, MD

## 2016-02-23 NOTE — Progress Notes (Signed)
Jeremy Fields is a 70 y.o. male  [id number]  Primary Cardiologist: Adrian BlackwaterShaukat Mclane Arora Reason for Consultation: Atrial fibrillation  HPI: This is a 70 year old white male with a past medical history of COPD presented to the hospital with atrial fibrillation and rapid ventricular response rate which was not responding to IV Cardizem and I was asked to evaluate the patient. Patient was started on IV amiodarone but with combination of IV amiodarone and Cardizem but pressure dropped and Cardizem was stopped along with amiodarone. Patient right now is in sinus rhythm and is feeling much better. F2   Review of Systems: No orthopnea PND or leg swelling   Past Medical History  Diagnosis Date  . Atrial fibrillation (HCC)   . COPD (chronic obstructive pulmonary disease) (HCC)     Medications Prior to Admission  Medication Sig Dispense Refill  . albuterol (PROVENTIL HFA;VENTOLIN HFA) 108 (90 Base) MCG/ACT inhaler Inhale 2 puffs into the lungs every 6 (six) hours as needed for wheezing or shortness of breath.    . ALPRAZolam (XANAX) 0.25 MG tablet Take 0.25 mg by mouth at bedtime as needed for sleep.    . celecoxib (CELEBREX) 200 MG capsule Take 200 mg by mouth 2 (two) times daily.    . Fluticasone-Salmeterol (ADVAIR) 500-50 MCG/DOSE AEPB Inhale 1 puff into the lungs 2 (two) times daily.    Marland Kitchen. ipratropium-albuterol (DUONEB) 0.5-2.5 (3) MG/3ML SOLN Take 3 mLs by nebulization every 4 (four) hours as needed (for wheezing/shortness of breath).    . metoprolol succinate (TOPROL-XL) 50 MG 24 hr tablet Take 50 mg by mouth daily. Take with or immediately following a meal.    . Polyethyl Glycol-Propyl Glycol (SYSTANE) 0.4-0.3 % SOLN Apply to eye.    . predniSONE (DELTASONE) 10 MG tablet Take 10 mg by mouth as directed. Take 6 tabs daily for 4 days, then 4 tabs daily for 4 days, then 2 tabs daily for 4 days.       Marland Kitchen. amiodarone  200 mg Oral Daily  . apixaban  5 mg Oral BID  . azithromycin  500 mg Intravenous  Q24H  . celecoxib  200 mg Oral BID  . mirtazapine  7.5 mg Oral QHS  . mometasone-formoterol  2 puff Inhalation BID  . nicotine  21 mg Transdermal Daily  . piperacillin-tazobactam (ZOSYN)  IV  3.375 g Intravenous Q8H  . polyvinyl alcohol  1 drop Both Eyes Daily  . potassium chloride  40 mEq Oral Once  . predniSONE  50 mg Oral Q breakfast  . roflumilast  500 mcg Oral Daily  . sodium chloride flush  3 mL Intravenous Q12H  . tiotropium  1 capsule Inhalation Daily    Infusions:    No Known Allergies  Social History   Social History  . Marital Status: Married    Spouse Name: N/A  . Number of Children: N/A  . Years of Education: N/A   Occupational History  . Not on file.   Social History Main Topics  . Smoking status: Current Every Day Smoker  . Smokeless tobacco: Not on file  . Alcohol Use: No  . Drug Use: Not on file  . Sexual Activity: Not on file   Other Topics Concern  . Not on file   Social History Narrative    No family history on file.  PHYSICAL EXAM: Filed Vitals:   02/23/16 0528 02/23/16 0532  BP: 88/50 93/57  Pulse: 94 90  Temp:    Resp:  Intake/Output Summary (Last 24 hours) at 02/23/16 0911 Last data filed at 02/23/16 0803  Gross per 24 hour  Intake    240 ml  Output   1025 ml  Net   -785 ml    General:  Well appearing. No respiratory difficulty HEENT: normal Neck: supple. no JVD. Carotids 2+ bilat; no bruits. No lymphadenopathy or thryomegaly appreciated. Cor: PMI nondisplaced. Regular rate & rhythm. No rubs, gallops or murmurs. Lungs: clear Abdomen: soft, nontender, nondistended. No hepatosplenomegaly. No bruits or masses. Good bowel sounds. Extremities: no cyanosis, clubbing, rash, edema Neuro: alert & oriented x 3, cranial nerves grossly intact. moves all 4 extremities w/o difficulty. Affect pleasant.  ECG: K fibrillation with rapid ventricular response rate and inferolateral ST depression  Results for orders placed or performed  during the hospital encounter of 02/21/16 (from the past 24 hour(s))  Basic metabolic panel     Status: Abnormal   Collection Time: 02/23/16 12:15 AM  Result Value Ref Range   Sodium 137 135 - 145 mmol/L   Potassium 3.3 (L) 3.5 - 5.1 mmol/L   Chloride 107 101 - 111 mmol/L   CO2 20 (L) 22 - 32 mmol/L   Glucose, Bld 109 (H) 65 - 99 mg/dL   BUN 20 6 - 20 mg/dL   Creatinine, Ser 1.61 0.61 - 1.24 mg/dL   Calcium 8.2 (L) 8.9 - 10.3 mg/dL   GFR calc non Af Amer >60 >60 mL/min   GFR calc Af Amer >60 >60 mL/min   Anion gap 10 5 - 15  CBC     Status: Abnormal   Collection Time: 02/23/16 12:15 AM  Result Value Ref Range   WBC 25.5 (H) 3.8 - 10.6 K/uL   RBC 3.73 (L) 4.40 - 5.90 MIL/uL   Hemoglobin 10.9 (L) 13.0 - 18.0 g/dL   HCT 09.6 (L) 04.5 - 40.9 %   MCV 86.9 80.0 - 100.0 fL   MCH 29.2 26.0 - 34.0 pg   MCHC 33.6 32.0 - 36.0 g/dL   RDW 81.1 (H) 91.4 - 78.2 %   Platelets 269 150 - 440 K/uL  Troponin I     Status: Abnormal   Collection Time: 02/23/16 12:15 AM  Result Value Ref Range   Troponin I 0.10 (H) <0.031 ng/mL  Magnesium     Status: None   Collection Time: 02/23/16 12:15 AM  Result Value Ref Range   Magnesium 1.8 1.7 - 2.4 mg/dL   Dg Chest 2 View  9/56/2130  CLINICAL DATA:  Shortness of Breath EXAM: CHEST  2 VIEW COMPARISON:  January 03, 2016 chest radiograph and chest CT June 08, 2015 FINDINGS: Lungs remain hyperexpanded. There is a new area of airspace consolidation in the posterior segment of the left upper lobe. There is also extensive upper lobe scarring bilaterally, more severe on the left than on the right, not appreciably changed. Nodular opacities bilaterally, more notable in the right mid and upper lung zones, remain without change. No new opacity is evident by radiography. The heart size is normal. 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 No adenopathy is evident. No bone lesions. IMPRESSION: There is increased  opacity in the posterior segment of the left upper lobe compared to prior studies. This appearance is most likely due to pneumonia superimposed on parenchymal lung scarring. Elsewhere, lung scarring and nodular opacities remain stable. There is no change in cardiac silhouette. Followup PA and lateral chest radiographs recommended in 3-4 weeks following trial of antibiotic  therapy to ensure resolution and exclude underlying malignancy. Electronically Signed   By: Bretta Bang III M.D.   On: 02/21/2016 11:19     ASSESSMENT AND PLAN: Atrial fibrillation with rapid ventricular response rate responded very well with amiodarone. Advise starting the amiodarone 400 mg by mouth daily and as well as Eliquis 5 mg by mouth twice a day also we'll get an echocardiogram.  Sayre Witherington A

## 2016-02-23 NOTE — Progress Notes (Signed)
Alert and oriented. No complaints of pain. Started on 200mg  of amiodarone once a day and eliquis. At rest patient's heart rate stays around 100, sinus rhythm. When patient stands his heart rate goes as high as 140, sinus tach. Just now took patient to bathroom and he became very short of breath and had to sit down after walking back to bed. After sitting heart rate came down to the 1 teen's. Oxygen saturation maintained at 91% on room air. Patient feels better now that he is sitting down. Still receiving IV antibiotics for pneumonia. Patient hopes to discharge tomorrow.

## 2016-02-23 NOTE — Progress Notes (Signed)
SATURATION QUALIFICATIONS: (This note is used to comply with regulatory documentation for home oxygen)  Patient Saturations on Room Air at Rest = 93%  Patient Saturations on Room Air while Ambulating = 90%   Patient Saturations on  Liters of oxygen while Ambulating = N/A  Please briefly explain why patient needs home oxygen: 

## 2016-02-23 NOTE — Care Management Important Message (Signed)
Important Message  Patient Details  Name: Jeremy Fields MRN: [id number] Date of Birth: 1945-10-24   Medicare Important Message Given:  Yes    Olegario MessierKathy A Berdena Cisek 02/23/2016, 1:10 PM

## 2016-02-23 NOTE — Progress Notes (Signed)
Pharmacy Antibiotic Note  Jeremy Fields is a 70 y.o. male admitted on 02/21/2016 with pneumonia.  Pharmacy has been consulted for piperacillin/tazobactam dosing.  Patient with CAP, per MD to continue broad spectrum antibiotics due to chronic steroid use.  Plan: Piperacillin/tazobactam 3.375 g IV q8h EI  Also on azithromycin 500mg  IV Q24H   Height: 5\' 9"  (175.3 cm) Weight: 121 lb 14.4 oz (55.293 kg) IBW/kg (Calculated) : 70.7  Temp (24hrs), Avg:97.9 F (36.6 C), Min:97.5 F (36.4 C), Max:98.6 F (37 C)   Recent Labs Lab 02/21/16 1039 02/21/16 1228 02/22/16 0259 02/23/16 0015  WBC 36.3*  --  26.6* 25.5*  CREATININE 0.82  --  0.79 0.74  LATICACIDVEN  --  0.8  --   --     Estimated Creatinine Clearance: 69.1 mL/min (by C-G formula based on Cr of 0.74).    No Known Allergies  Antimicrobials this admission: levofloxacin 6/15 x1 Piperacillin/tazobactam 6/14 >>  Azithromycin 6/16 >>  Dose adjustments this admission:  Microbiology results: 6/14 BCx: NGTD x 2 6/14 UCx: NG final   Thank you for allowing pharmacy to be a part of this patient's care.  Martyn MalayBarefoot,[name], PharmD Clinical Pharmacist 02/23/2016 11:15 AM

## 2016-02-23 NOTE — Care Management (Addendum)
Eliquis coupon given. Patient uses [facility], [place] [phone]). Requested qualifying O2 sats from primary RN.

## 2016-02-24 ENCOUNTER — Inpatient Hospital Stay
Admit: 2016-02-24 | Discharge: 2016-02-24 | Disposition: A | Discharge status: Home / Self Care | Payer: Medicare PPO | Attending: Cardiovascular Disease | Admitting: Cardiovascular Disease

## 2016-02-24 LAB — BASIC METABOLIC PANEL
Anion gap: 5 (ref 5–15)
BUN: 30 mg/dL — AB (ref 6–20)
CHLORIDE: 111 mmol/L (ref 101–111)
CO2: 25 mmol/L (ref 22–32)
CREATININE: 0.64 mg/dL (ref 0.61–1.24)
Calcium: 8.3 mg/dL — ABNORMAL LOW (ref 8.9–10.3)
GFR calc non Af Amer: >60 mL/min (ref >60)
Glucose, Bld: 106 mg/dL — ABNORMAL HIGH (ref 65–99)
POTASSIUM: 4.9 mmol/L (ref 3.5–5.1)
SODIUM: 141 mmol/L (ref 135–145)

## 2016-02-24 LAB — ECHOCARDIOGRAM COMPLETE
AVPG: 3 mmHg
AVPKVEL: 86.9 cm/s
E decel time: 169 msec
HEIGHTINCHES: 69 in
MV Dec: 169
MV pk A vel: 68.9 m/s
MV pk E vel: 61.6 m/s
Mean grad: 216 mmHg
Weight: 1963.2 oz

## 2016-02-24 LAB — CBC
HEMATOCRIT: 30.3 % — AB (ref 40.0–52.0)
Hemoglobin: 9.8 g/dL — ABNORMAL LOW (ref 13.0–18.0)
MCH: 28.7 pg (ref 26.0–34.0)
MCHC: 32.5 g/dL (ref 32.0–36.0)
MCV: 88.3 fL (ref 80.0–100.0)
PLATELETS: 298 10*3/uL (ref 150–440)
RBC: 3.43 MIL/uL — AB (ref 4.40–5.90)
RDW: 17.1 % — ABNORMAL HIGH (ref 11.5–14.5)
WBC: 14.1 10*3/uL — AB (ref 3.8–10.6)

## 2016-02-24 MED ORDER — GUAIFENESIN 100 MG/5ML PO SOLN: 5.0000 mL | Freq: Four times a day (QID) | ORAL | Status: DC | PRN

## 2016-02-24 MED ORDER — MIRTAZAPINE 7.5 MG PO TABS: 7.5000 mg | ORAL_TABLET | Freq: Every day | ORAL | Status: DC

## 2016-02-24 MED ORDER — AMIODARONE HCL 200 MG PO TABS: 200.0000 mg | ORAL_TABLET | Freq: Every day | ORAL | Status: AC

## 2016-02-24 MED ORDER — APIXABAN 5 MG PO TABS: 5.0000 mg | ORAL_TABLET | Freq: Two times a day (BID) | ORAL | Status: DC

## 2016-02-24 MED ORDER — DIGOXIN 125 MCG PO TABS: 0.1250 mg | ORAL_TABLET | Freq: Every day | ORAL | Status: AC

## 2016-02-24 MED ORDER — AMOXICILLIN-POT CLAVULANATE 875-125 MG PO TABS: 1.0000 | ORAL_TABLET | Freq: Two times a day (BID) | ORAL | Status: DC

## 2016-02-24 MED ORDER — DIGOXIN 125 MCG PO TABS
0.1250 mg | ORAL_TABLET | Freq: Every day | ORAL | Status: DC
  Administered 2016-02-24: 0.125 mg via ORAL
  Filled 2016-02-24: qty 1

## 2016-02-24 MED ORDER — ROFLUMILAST 500 MCG PO TABS: 500.0000 ug | ORAL_TABLET | Freq: Every day | ORAL | Status: AC

## 2016-02-24 MED ORDER — TIOTROPIUM BROMIDE MONOHYDRATE 2.5 MCG/ACT IN AERS: 1.0000 | INHALATION_SPRAY | Freq: Every day | RESPIRATORY_TRACT | Status: AC

## 2016-02-24 NOTE — Discharge Instructions (Signed)
Heart healthy diet. Activity as tolerated. Smoking cessation. HHPT, new Home O2 [place] 1-2 L.

## 2016-02-24 NOTE — Progress Notes (Signed)
*  PRELIMINARY RESULTS* Echocardiogram 2D Echocardiogram has been performed.  Jeremy Fields 02/24/2016, 10:37 AM

## 2016-02-24 NOTE — Care Management Note (Signed)
Case Management Note  Patient Details  Name: Jeremy Fields MRN: [id number] Date of Birth: Sep 12, 1945  Subjective/Objective:        Discussed discharge planning with [name]. [place] and has [employer] insurance. A referral was called to [place] [facility] who [place] and accepts [employer] requesting home health PT and RN. [name] does not qualify for new home oxygen at this time. .             Action/Plan:   Expected Discharge Date:                  Expected Discharge Plan:     In-House Referral:     Discharge planning Services  CM Consult  Post Acute Care Choice:  Home Health, Durable Medical Equipment Choice offered to:  Patient  DME Arranged:  Oxygen DME Agency:  [facility]  HH Arranged:    HH Agency:     Status of Service:  In process, will continue to follow  Medicare Important Message Given:  Yes Date Medicare IM Given:    Medicare IM give by:    Date Additional Medicare IM Given:    Additional Medicare Important Message give by:     If discussed at Long Length of Stay Meetings, dates discussed:    Additional Comments:  Ramya Vanbergen A, RN 02/24/2016, 11:51 AM

## 2016-02-24 NOTE — Care Management Note (Addendum)
Case Management Note  Patient Details  Name: Jeremy MinaDennis M Fields MRN: [id number] Date of Birth: [birth year]  Subjective/Objective:        A referral for new home oxygen was faxed to [facility] and this writer left a voice message on the phone of  Christeen DouglasBetsy Sweetser at [facility]. Request for a portable oxygen tank to be delivered [name] today and then home oxygen set up at his home.  Phone number to [facility] is [phone]. Verified with "Vonna KotykJay" at [facility] that [name]'s home address is within the [facility] service area.             Action/Plan:   Expected Discharge Date:                  Expected Discharge Plan:     In-House Referral:     Discharge planning Services  CM Consult  Post Acute Care Choice:  Home Health, Durable Medical Equipment Choice offered to:  Patient  DME Arranged:  Oxygen DME Agency:  [facility]  HH Arranged:    HH Agency:     Status of Service:  In process, will continue to follow  Medicare Important Message Given:  Yes Date Medicare IM Given:    Medicare IM give by:    Date Additional Medicare IM Given:    Additional Medicare Important Message give by:     If discussed at Long Length of Stay Meetings, dates discussed:    Additional Comments:  Joelly Bolanos A, RN 02/24/2016, 1:19 PM

## 2016-02-24 NOTE — Progress Notes (Signed)
Discharge instructions given to patient and daughter. Follow up has been set with cardiologist for Tuesday. Patient's daughter will make follow up appointments with PCP and pulmonologist. Prescriptions given as well as education on all new medications. Educated on afib, and tachycardia as well. Oxygen has now arrived to the room. IV's and tele removed. Jeremy Fields with care management notified that patient is going to stay with his daughter for a little while, so care management is updating home health companies with her address and contact information. Questions answered and patient is getting dressed to leave now.

## 2016-02-24 NOTE — Care Management Note (Signed)
Case Management Note  Patient Details  Name: Jeremy Fields MRN: [id number] Date of Birth: Jun 14, 1946  Subjective/Objective:     Received call from [name]'s nurse reporting that his O2 sats dropped to 4687 with exertion on room air.  Call to Dr Imogene Burnchen to see if he will order new home health oxygen.               Action/Plan:   Expected Discharge Date:                  Expected Discharge Plan:     In-House Referral:     Discharge planning Services  CM Consult  Post Acute Care Choice:  Home Health, Durable Medical Equipment Choice offered to:  Patient  DME Arranged:  Oxygen DME Agency:  [facility]  HH Arranged:    HH Agency:     Status of Service:  In process, will continue to follow  Medicare Important Message Given:  Yes Date Medicare IM Given:    Medicare IM give by:    Date Additional Medicare IM Given:    Additional Medicare Important Message give by:     If discussed at Long Length of Stay Meetings, dates discussed:    Additional Comments:  Noboru Bidinger A, RN 02/24/2016, 12:55 PM

## 2016-02-24 NOTE — Evaluation (Signed)
Physical Therapy Evaluation Patient Details Name: Jeremy Fields MRN: [id number] DOB: 26-Jun-1946 Today's Date: 02/24/2016   History of Present Illness  70 y/o here with pneumonia.  He reports that he regularly has these breathing flare ups, they last a few days and then he's "fine."  Clinical Impression  Pt is able to ambulate ~125 ft on room air but his O2 drops (to 87%) and his HR increases (to 130s) with the effort. He does report feeling fatigued and that normally he is able to do a lot more, but also reports he has these spells and generally recovers quickly.  Pt is safe with mobility and ambulation with LOBs or significant safety concerns apart from activity tolerance and fatigue.     Follow Up Recommendations Home health PT    Equipment Recommendations       Recommendations for Other Services       Precautions / Restrictions Precautions Precautions: Fall Restrictions Weight Bearing Restrictions: No      Mobility  Bed Mobility Overal bed mobility: Independent             General bed mobility comments: Pt able to get to EOB w/o issue  Transfers Overall transfer level: Independent Equipment used: None             General transfer comment: Pt able to rise to standing w/o assist and shows good relative balance and confidence  Ambulation/Gait Ambulation/Gait assistance: Supervision Ambulation Distance (Feet): 125 Feet Assistive device: None       General Gait Details: Pt on room air O2 during ambulation.  His HR increases to 130s and O2 drops as low as 87%, though stays ~90 much of the time.  Stairs            Wheelchair Mobility    Modified Rankin (Stroke Patients Only)       Balance Overall balance assessment: Independent                                           Pertinent Vitals/Pain Pain Assessment: No/denies pain    Home Living Family/patient expects to be discharged to:: Private residence Living Arrangements:  Alone Available Help at Discharge: Family   Home Access: Stairs to enter Entrance Stairs-Rails: None (can hold storm door) Entrance Stairs-Number of Steps: 4          Prior Function Level of Independence: Independent         Comments: Pt reports that he is normally able to be active and out of the house     Hand Dominance        Extremity/Trunk Assessment   Upper Extremity Assessment: Overall WFL for tasks assessed           Lower Extremity Assessment: Overall WFL for tasks assessed         Communication   Communication: No difficulties  Cognition Arousal/Alertness: Awake/alert Behavior During Therapy: WFL for tasks assessed/performed Overall Cognitive Status: Within Functional Limits for tasks assessed                      General Comments      Exercises        Assessment/Plan    PT Assessment Patient needs continued PT services  PT Diagnosis Difficulty walking;Generalized weakness   PT Problem List Decreased activity tolerance;Decreased strength;Decreased safety awareness;Decreased balance  PT Treatment Interventions Stair  training;Gait training;Functional mobility training;Balance training;Therapeutic exercise;Therapeutic activities   PT Goals (Current goals can be found in the Care Plan section) Acute Rehab PT Goals Patient Stated Goal: go home  PT Goal Formulation: With patient Time For Goal Achievement: 03/09/16 Potential to Achieve Goals: Fair    Frequency Min 2X/week   Barriers to discharge        Co-evaluation               End of Session Equipment Utilized During Treatment: Gait belt Activity Tolerance: Patient limited by fatigue Patient left: with bed alarm set;with call bell/phone within reach Nurse Communication:  (vitals during activity)         Time: [id number] PT Time Calculation (min) (ACUTE ONLY): 16 min   Charges:   PT Evaluation $PT Eval Low Complexity: 1 Procedure     PT G Codes:         Jeremy ProGalen Fields Jeremy Fields, DPT 02/24/2016, 1:19 PM

## 2016-02-24 NOTE — Progress Notes (Signed)
SUBJECTIVE: Patient denies any chest pain but does have some shortness of breath   Filed Vitals:   02/23/16 0935 02/23/16 1109 02/23/16 1947 02/24/16 0452  BP: 86/48 99/54 127/85 124/77  Pulse: 96 102 102 94  Temp:  97.6 F (36.4 C) 97.6 F (36.4 C) 97.8 F (36.6 C)  TempSrc:   Oral Oral  Resp: 20 20 18 18   Height:      Weight:    122 lb 11.2 oz (55.656 kg)  SpO2: 98% 97% 97% 93%    Intake/Output Summary (Last 24 hours) at 02/24/16 1046 Last data filed at 02/24/16 0949  Gross per 24 hour  Intake    640 ml  Output    375 ml  Net    265 ml    LABS: Basic Metabolic Panel:  Recent Labs  19/14/7806/16/17 0015 02/24/16 0538  NA 137 141  K 3.3* 4.9  CL 107 111  CO2 20* 25  GLUCOSE 109* 106*  BUN 20 30*  CREATININE 0.74 0.64  CALCIUM 8.2* 8.3*  MG 1.8  --    Liver Function Tests: No results for input(s): AST, ALT, ALKPHOS, BILITOT, PROT, ALBUMIN in the last 72 hours. No results for input(s): LIPASE, AMYLASE in the last 72 hours. CBC:  Recent Labs  02/23/16 0015 02/24/16 0538  WBC 25.5* 14.1*  HGB 10.9* 9.8*  HCT 32.4* 30.3*  MCV 86.9 88.3  PLT 269 298   Cardiac Enzymes:  Recent Labs  02/21/16 2105 02/22/16 0259 02/23/16 0015  TROPONINI <0.03 <0.03 0.10*   BNP: Invalid input(s): POCBNP D-Dimer: No results for input(s): DDIMER in the last 72 hours. Hemoglobin A1C: No results for input(s): HGBA1C in the last 72 hours. Fasting Lipid Panel: No results for input(s): CHOL, HDL, LDLCALC, TRIG, CHOLHDL, LDLDIRECT in the last 72 hours. Thyroid Function Tests: No results for input(s): TSH, T4TOTAL, T3FREE, THYROIDAB in the last 72 hours.  Invalid input(s): FREET3 Anemia Panel: No results for input(s): VITAMINB12, FOLATE, FERRITIN, TIBC, IRON, RETICCTPCT in the last 72 hours.   PHYSICAL EXAM General: Well developed, well nourished, in no acute distress HEENT:  Normocephalic and atramatic Neck:  No JVD.  Lungs: Clear bilaterally to auscultation and  percussion. Heart: HRRR . Normal S1 and S2 without gallops or murmurs.  Abdomen: Bowel sounds are positive, abdomen soft and non-tender  Msk:  Back normal, normal gait. Normal strength and tone for age. Extremities: No clubbing, cyanosis or edema.   Neuro: Alert and oriented X 3. Psych:  Good affect, responds appropriately  TELEMETRY:Sinus rhythm but occasional sinus tachycardia when he moves around  ASSESSMENT AND PLAN: Status post atrial fibrillation with rapid ventricular response rate now in sinus rhythm but has occasional sinus tachycardia when he moves around. Blood pressure is low so we will start the patient on digoxin 0.125 once a day. Echocardiogram was done which shows left ventricle ejection fraction is normal. Patient can go home with follow-up in the office on Tuesday at 2 PM appointment has been given.  Active Problems:   Pneumonia    Jeremy NancyKHAN,Jancarlo Biermann A, MD, [facility] 02/24/2016 10:46 AM     Progress

## 2016-02-24 NOTE — Progress Notes (Signed)
Patient is to be discharged pending ECHO and physical therapy consult. Patient getting ECHO done now and PT has been notified of pending discharge. Stated they will see patient today.

## 2016-02-24 NOTE — Progress Notes (Signed)
SATURATION QUALIFICATIONS: (This note is used to comply with regulatory documentation for home oxygen)  Patient Saturations on Room Air at Rest = 93%  Patient Saturations on Room Air while Ambulating = 87%  Patient Saturations on 2 Liters of oxygen while Ambulating = 92%  Please briefly explain why patient needs home oxygen: Patient walked with physical therapy today and oxygen saturation dropped to 87%. Will need oxygen while ambulating at home.

## 2016-02-24 NOTE — Discharge Summary (Addendum)
[facility] - [place] at [facility]   PATIENT NAME: Jeremy Fields    MR#:  [id number]  DATE OF BIRTH:  September 13, 1945  DATE OF ADMISSION:  02/21/2016 ADMITTING PHYSICIAN: Adrian Saran, MD  DATE OF DISCHARGE: 02/24/2016 PRIMARY CARE PHYSICIAN: Lyndon Code, MD    ADMISSION DIAGNOSIS:  Sinus tachycardia (HCC) [R00.0] Elevated troponin [R79.89] CAP (community acquired pneumonia) [J18.9] Sepsis, due to unspecified organism (HCC) [A41.9]   DISCHARGE DIAGNOSIS:  Acute hypoxic respiratory failure Community-acquired pneumonia and sepsis  PAF with RVR SECONDARY DIAGNOSIS:   Past Medical History  Diagnosis Date  . Atrial fibrillation (HCC)   . 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  1. Acute hypoxic respiratory failure: This is due to pneumonia. BiPAP was weaned off. He was O2 [place] 4 L and was weaned off O2 [place], but hypoxia with SAT at 86% in room air. He needs home O2 [place] 1-2 L. Antibiotics for community-acquired pneumonia.  2. Community-acquired pneumonia and sepsis (hypotension, tachycardia and leukocytosis, though Patient is on chronic steroids)  Treated with Zosyn and Levaquin. NS iv.bolus prn for hypotension. Negative blood culture. Change to po augmentin. F/u Dr. Welton Flakes, pulmonary as outpatient.  3. COPD. Stable. Patient is currently on steroid taper prescribed by his pulmonologist. Continue with steroid taper.  Dr Welton Flakes is attempting to decrease dose of his chronic steroids from 20 mg to 5 mg. Continue inhalers.  4. PAF with RVR: back to NSR. Off cardizem drip. Per Dr. Welton Flakes, started the amiodarone 400 mg by mouth daily and as well as Eliquis 5 mg by mouth twice a day. Add digoxin and f/u echocardiogram. Per Dr. Welton Flakes. continue Metoprolol.  5.  Tobacco dependence: Encouraged to stop smoking and counseled, on nicotine Patch.  6. Elevated WBC from PNA and steroids. Improving.  7. Hx MAI: Patient discontinued treatment after 9 months, noncompliance. May resume treatment after discharge per Dr. Welton Flakes.  8. Hyponatremia: from poor po intake and PNA. Improved.  Hypokalemia. Given potassium supplement and improved. Normal magnesium level.  D/w cardiologist Dr. Welton Flakes.  DISCHARGE CONDITIONS:   Stable, discharge to home with HHPT today.  CONSULTS OBTAINED:  Treatment Team:  Yevonne Pax, MD Laurier Nancy, MD  DRUG ALLERGIES:  No Known Allergies  DISCHARGE MEDICATIONS:   Current Discharge Medication List    START taking these medications   Details  amiodarone (PACERONE) 200 MG tablet Take 1 tablet (200 mg total) by mouth daily. Qty: 30 tablet, Refills: 0    amoxicillin-clavulanate (AUGMENTIN) 875-125 MG tablet Take 1 tablet by mouth 2 (two) times daily. Qty: 16 tablet, Refills: 0    apixaban (ELIQUIS) 5 MG TABS tablet Take 1 tablet (5 mg total) by mouth 2 (two) times daily. Qty: 60 tablet, Refills: 0    digoxin (LANOXIN) 0.125 MG tablet Take 1 tablet (0.125 mg total) by mouth daily. Qty: 30 tablet, Refills: 0    guaiFENesin (ROBITUSSIN) 100 MG/5ML SOLN Take 5 mLs (100 mg total) by mouth every 6 (six) hours as needed for cough or to loosen phlegm. Qty: 236 mL, Refills: 0      CONTINUE these medications which have CHANGED   Details  mirtazapine (REMERON) 7.5 MG tablet Take 1 tablet (7.5 mg total) by mouth at bedtime.    roflumilast (DALIRESP) 500 MCG TABS  tablet Take 1 tablet (500 mcg total) by mouth daily. Qty: 30 tablet, Refills: 0    Tiotropium Bromide Monohydrate (SPIRIVA RESPIMAT) 2.5 MCG/ACT AERS Inhale 1 puff into the lungs daily. Qty: 4 g, Refills: 0      CONTINUE these medications which have NOT CHANGED   Details  albuterol (PROVENTIL HFA;VENTOLIN HFA) 108 (90 Base) MCG/ACT inhaler Inhale 2 puffs into  the lungs every 6 (six) hours as needed for wheezing or shortness of breath.    ALPRAZolam (XANAX) 0.25 MG tablet Take 0.25 mg by mouth at bedtime as needed for sleep.    celecoxib (CELEBREX) 200 MG capsule Take 200 mg by mouth 2 (two) times daily.    Fluticasone-Salmeterol (ADVAIR) 500-50 MCG/DOSE AEPB Inhale 1 puff into the lungs 2 (two) times daily.    ipratropium-albuterol (DUONEB) 0.5-2.5 (3) MG/3ML SOLN Take 3 mLs by nebulization every 4 (four) hours as needed (for wheezing/shortness of breath).    metoprolol succinate (TOPROL-XL) 50 MG 24 hr tablet Take 50 mg by mouth daily. Take with or immediately following a meal.    Polyethyl Glycol-Propyl Glycol (SYSTANE) 0.4-0.3 % SOLN Apply to eye.    predniSONE (DELTASONE) 10 MG tablet Take 10 mg by mouth as directed. Take 6 tabs daily for 4 days, then 4 tabs daily for 4 days, then 2 tabs daily for 4 days.         DISCHARGE INSTRUCTIONS:    If you experience worsening of your admission symptoms, develop shortness of breath, life threatening emergency, suicidal or homicidal thoughts you must seek medical attention immediately by calling 911 or calling your MD immediately  if symptoms less severe.  You Must read complete instructions/literature along with all the possible adverse reactions/side effects for all the Medicines you take and that have been prescribed to you. Take any new Medicines after you have completely understood and accept all the possible adverse reactions/side effects.   Please note  You were cared for by a hospitalist during your hospital stay. If you have any questions about your discharge medications or the care you received while you were in the hospital after you are discharged, you can call the unit and asked to speak with the hospitalist on call if the hospitalist that took care of you is not available. Once you are discharged, your primary care physician will handle any further medical issues. Please note that NO  REFILLS for any discharge medications will be authorized once you are discharged, as it is imperative that you return to your primary care physician (or establish a relationship with a primary care physician if you do not have one) for your aftercare needs so that they can reassess your need for medications and monitor your lab values.    Today   SUBJECTIVE   No complaint. Off O2 [place].   VITAL SIGNS:  Blood pressure 113/72, pulse 96, temperature 97.7 F (36.5 C), temperature source Oral, resp. rate 19, height 5\' 9"  (1.753 m), weight 122 lb 11.2 oz (55.656 kg), SpO2 94 %.  I/O:   Intake/Output Summary (Last 24 hours) at 02/24/16 1257 Last data filed at 02/24/16 1143  Gross per 24 hour  Intake    640 ml  Output    375 ml  Net    265 ml    PHYSICAL EXAMINATION:  GENERAL:  70 y.o.-year-old patient lying in the bed with no acute distress.  EYES: Pupils equal, round, reactive to light and accommodation. No scleral icterus. Extraocular muscles intact.  HEENT:  Head atraumatic, normocephalic. Oropharynx and nasopharynx clear.  NECK:  Supple, no jugular venous distention. No thyroid enlargement, no tenderness.  LUNGS: Normal breath sounds bilaterally, no wheezing, rales,rhonchi or crepitation. No use of accessory muscles of respiration.  CARDIOVASCULAR: S1, S2 normal. No murmurs, rubs, or gallops.  ABDOMEN: Soft, non-tender, non-distended. Bowel sounds present. No organomegaly or mass.  EXTREMITIES: No pedal edema, cyanosis, or clubbing.  NEUROLOGIC: Cranial nerves II through XII are intact. Muscle strength 5/5 in all extremities. Sensation intact. Gait not checked.  PSYCHIATRIC: The patient is alert and oriented x 3.  SKIN: No obvious rash, lesion, or ulcer.   DATA REVIEW:   CBC  Recent Labs Lab 02/24/16 0538  WBC 14.1*  HGB 9.8*  HCT 30.3*  PLT 298    Chemistries   Recent Labs Lab 02/23/16 0015 02/24/16 0538  NA 137 141  K 3.3* 4.9  CL 107 111  CO2 20* 25  GLUCOSE  109* 106*  BUN 20 30*  CREATININE 0.74 0.64  CALCIUM 8.2* 8.3*  MG 1.8  --     Cardiac Enzymes  Recent Labs Lab 02/23/16 0015  TROPONINI 0.10*    Microbiology Results  Results for orders placed or performed during the hospital encounter of 02/21/16  Blood Culture (routine x 2)     Status: None (Preliminary result)   Collection Time: 02/21/16 12:28 PM  Result Value Ref Range Status   Specimen Description BLOOD LEFT HAND  Final   Special Requests   Final    BOTTLES DRAWN AEROBIC AND ANAEROBIC  SERO 5CC ANA 3CC   Culture NO GROWTH 2 DAYS  Final   Report Status PENDING  Incomplete  Blood Culture (routine x 2)     Status: None (Preliminary result)   Collection Time: 02/21/16 12:28 PM  Result Value Ref Range Status   Specimen Description BLOOD RIGHT FATTY CASTS  Final   Special Requests BOTTLES DRAWN AEROBIC AND ANAEROBIC  5CC  Final   Culture NO GROWTH 2 DAYS  Final   Report Status PENDING  Incomplete  Urine culture     Status: None   Collection Time: 02/21/16  1:49 PM  Result Value Ref Range Status   Specimen Description URINE, RANDOM  Final   Special Requests NONE  Final   Culture NO GROWTH Performed at [facility]   Final   Report Status 02/22/2016 FINAL  Final    RADIOLOGY:  No results found.      Management plans discussed with the patient, family and they are in agreement.  CODE STATUS:     Code Status Orders        Start     Ordered   02/21/16 1726  Full code   Continuous     02/21/16 1725    Code Status History    Date Active Date Inactive Code Status Order ID Comments User Context   This patient has a current code status but no historical code status.      TOTAL TIME TAKING CARE OF THIS PATIENT: 38 minutes.    Shaune Pollack M.D on 02/24/2016 at 12:57 PM  Between 7am to 6pm - Pager - [phone]  After 6pm go to [email or web address] - password [email or web address] [facility]  [place] [facility]  Office  [phone]  CC: Primary care physician;  Lyndon Code, MD

## 2016-02-26 NOTE — Progress Notes (Signed)
[facility]  Patient Status: [facility]. Will inform case manager Gweneth DimitriLisa Jacobs patient wants [facility]     If patient discharges after hours, please call [phone].   Dimple CaseyJason E Hinton 02/26/2016, 12:14 PM

## 2016-02-27 LAB — CULTURE, BLOOD (ROUTINE X 2)
CULTURE: NO GROWTH
Culture: NO GROWTH

## 2016-04-01 ENCOUNTER — Ambulatory Visit
Admission: RE | Admit: 2016-04-01 | Discharge: 2016-04-01 | Disposition: A | Discharge status: Home / Self Care | Payer: Medicare PPO | Source: Ambulatory Visit | Attending: Internal Medicine | Admitting: Internal Medicine

## 2016-04-01 ENCOUNTER — Other Ambulatory Visit: Payer: Self-pay | Admitting: Internal Medicine

## 2016-04-01 DIAGNOSIS — J181 Lobar pneumonia, unspecified organism: Secondary | ICD-10-CM

## 2016-04-01 DIAGNOSIS — J440 Chronic obstructive pulmonary disease with acute lower respiratory infection: Secondary | ICD-10-CM | POA: Diagnosis not present

## 2016-04-09 ENCOUNTER — Other Ambulatory Visit: Payer: Self-pay | Admitting: Physician Assistant

## 2016-04-09 DIAGNOSIS — J690 Pneumonitis due to inhalation of food and vomit: Secondary | ICD-10-CM

## 2016-04-15 ENCOUNTER — Ambulatory Visit: Admission: RE | Admit: 2016-04-15 | Payer: Medicare PPO | Source: Ambulatory Visit

## 2016-04-17 IMAGING — CT CT HEAD WITHOUT CONTRAST
1 series · 16 of 30 positions shown, 20 images · non-contrast
Comparison: None.

CLINICAL DATA: Slurred speech for several weeks.

EXAM:
CT HEAD WITHOUT CONTRAST
TECHNIQUE: Contiguous axial images were obtained from the base of the skull
through the vertex without intravenous contrast.

[Series 2: head wo · axial · 0.46mm/px · z∈[+51,+186]mm · 16 of 30 slices shown, 20 images]
[im 2/30  brain]
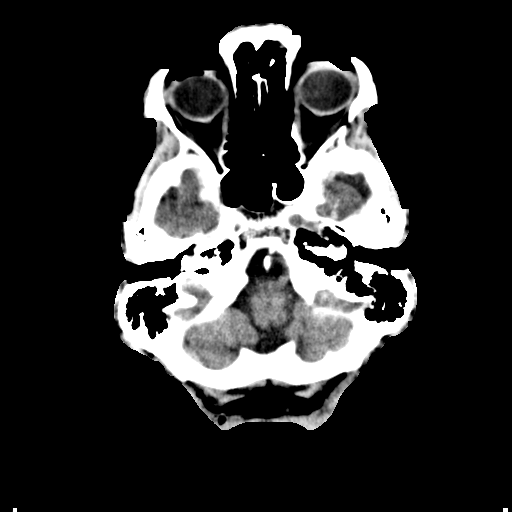
[im 2/30  bone]
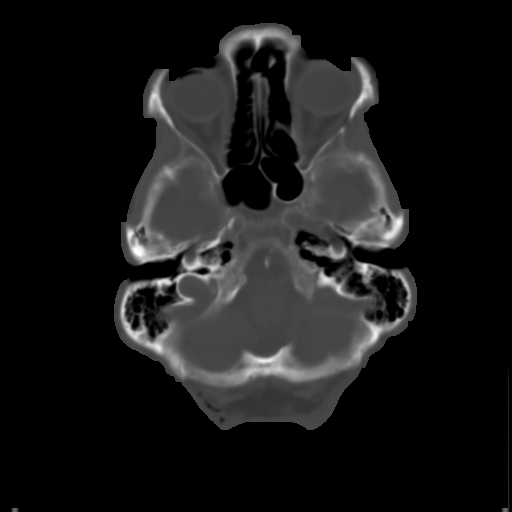
[im 4/30  brain]
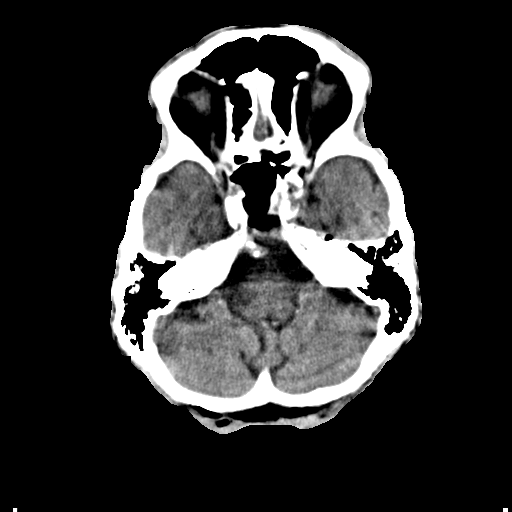
[im 6/30  brain]
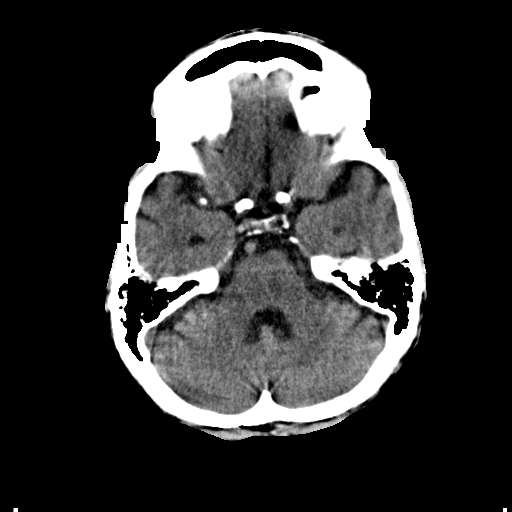
[im 8/30  brain]
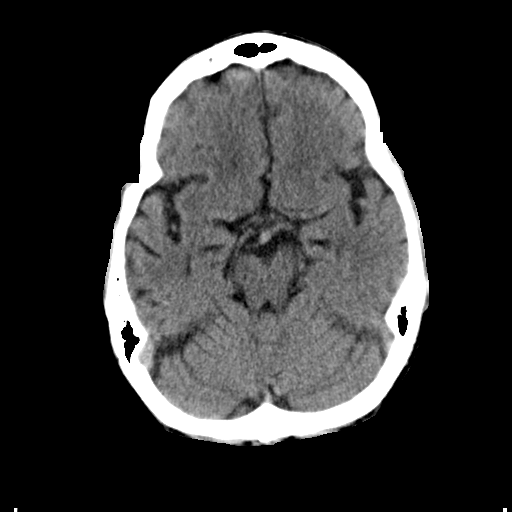
[im 9/30  brain]
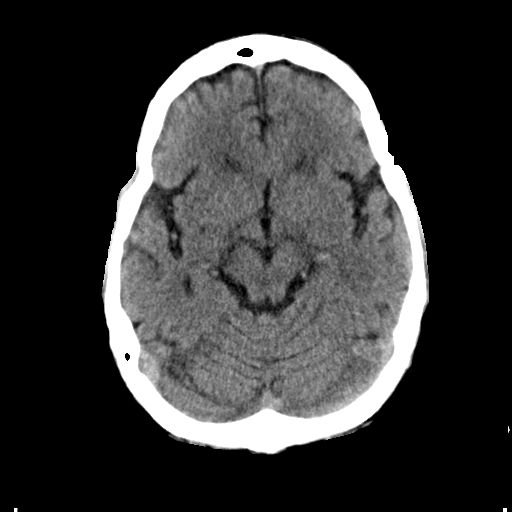
[im 9/30  bone]
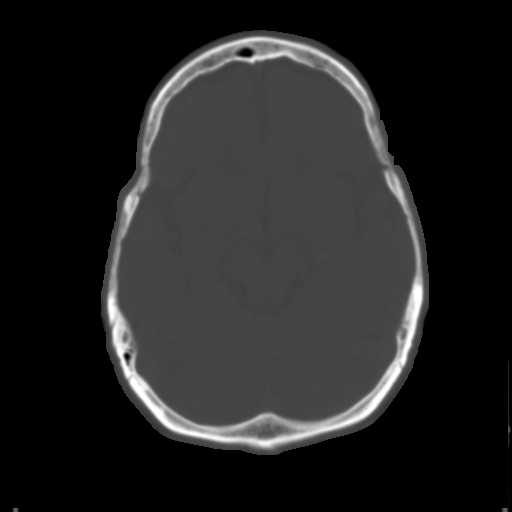
[im 11/30  brain]
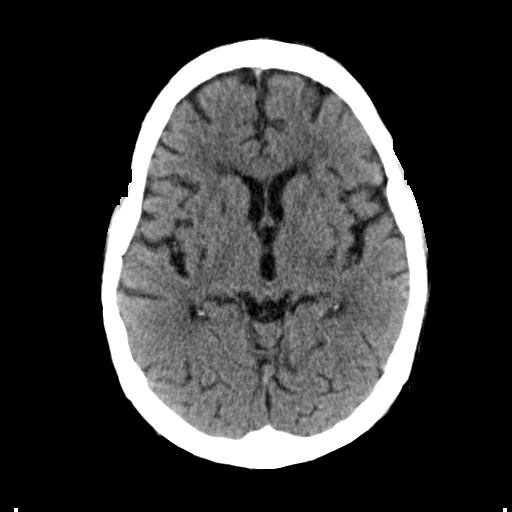
[im 13/30  brain]
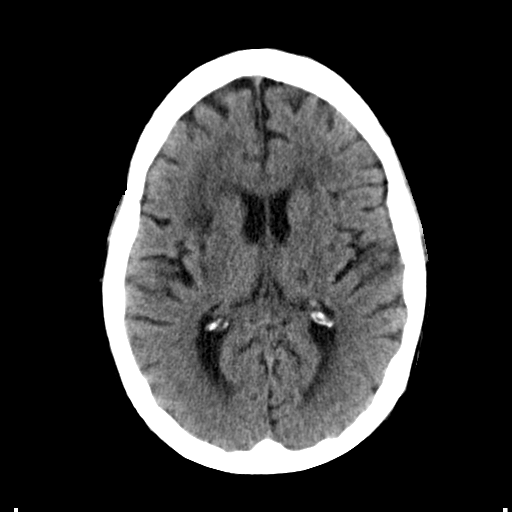
[im 15/30  brain]
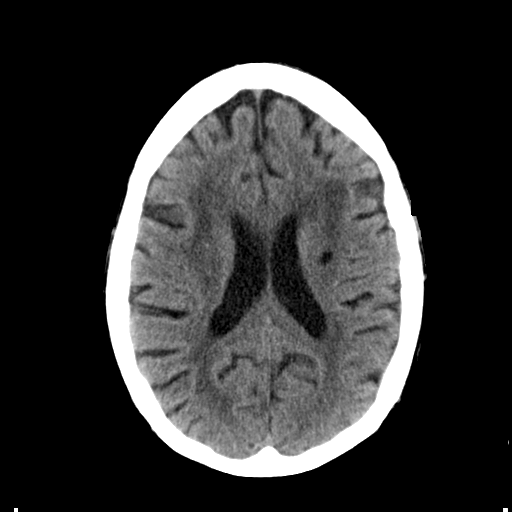
[im 16/30  brain]
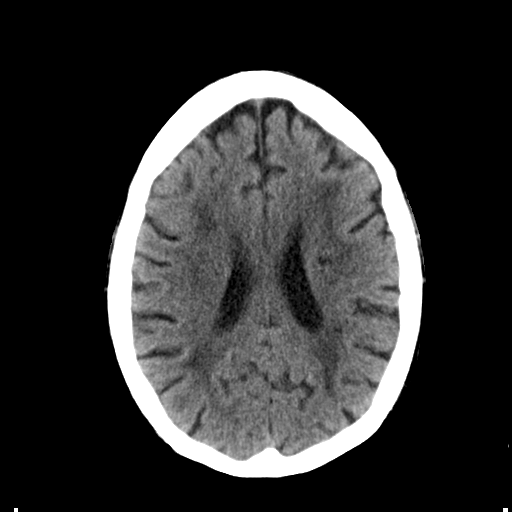
[im 16/30  bone]
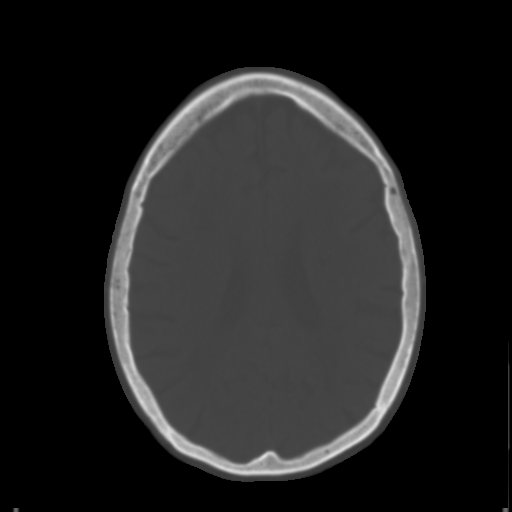
[im 18/30  brain]
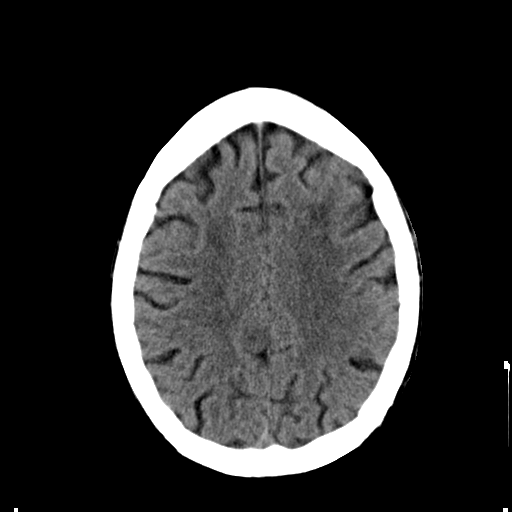
[im 20/30  brain]
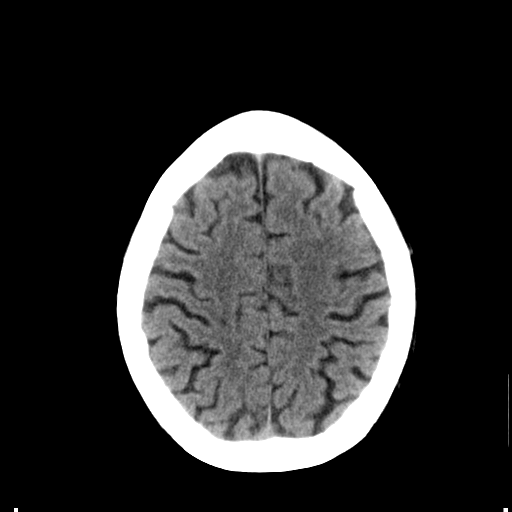
[im 22/30  brain]
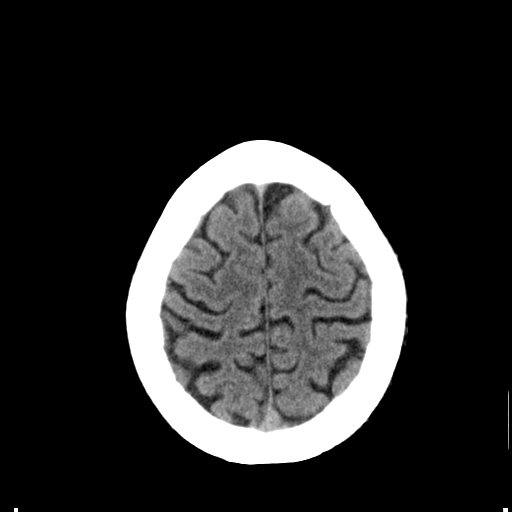
[im 23/30  brain]
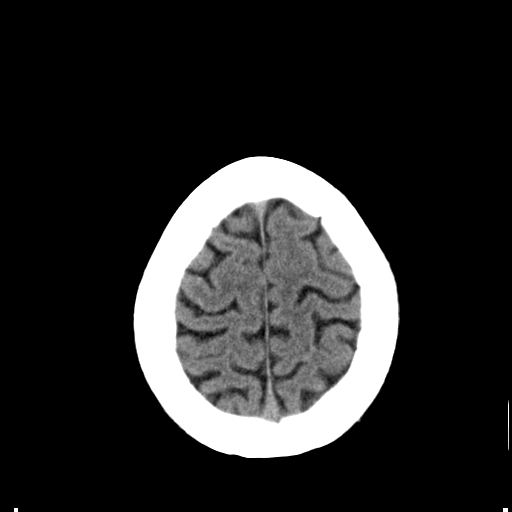
[im 23/30  bone]
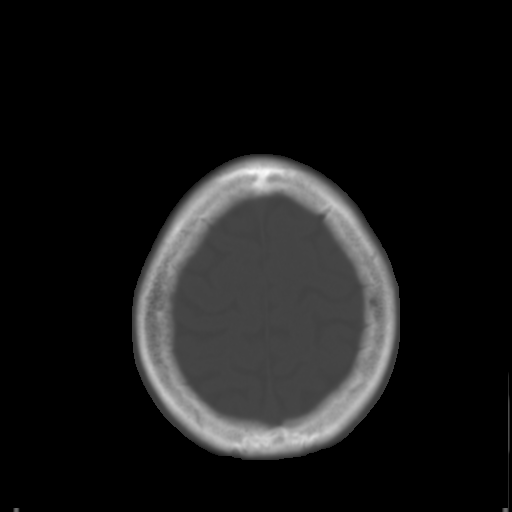
[im 25/30  brain]
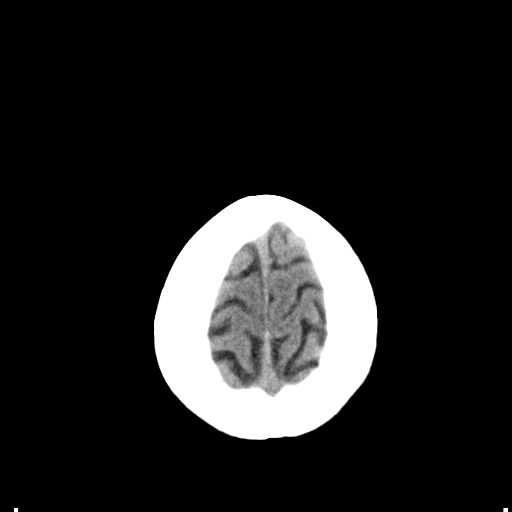
[im 27/30  brain]
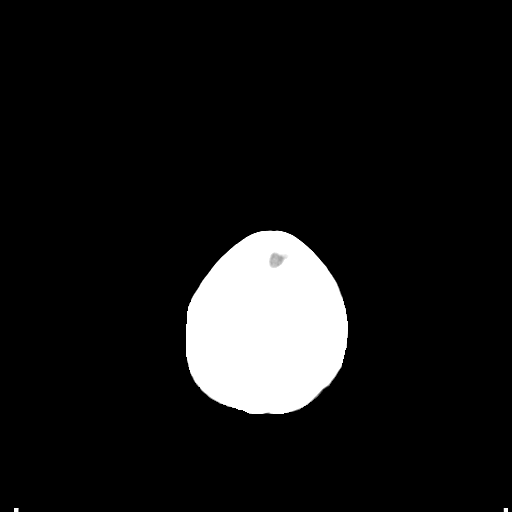
[im 29/30  brain]
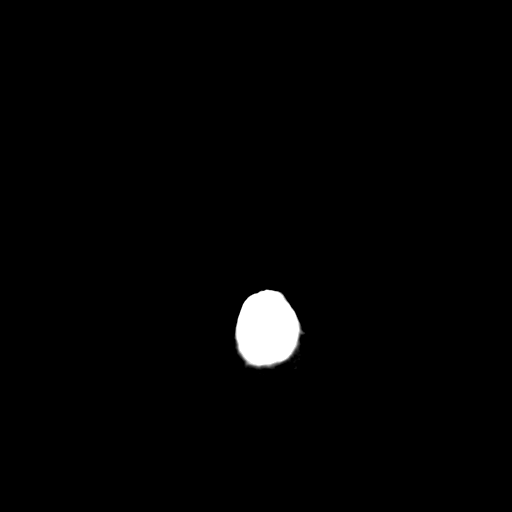

[16 of 30 positions shown; findings below may reference images not displayed]

FINDINGS: Ventricles, cisterns and other CSF spaces are within normal. There
is no mass, mass effect, shift of midline structures or acute
hemorrhage. There is chronic ischemic microvascular disease. Small
old bilateral low coronary infarcts over the basal ganglia. There is
an old 1 cm elliptical infarct over the left centrum semiovale. No
evidence of acute infarction. Moderate calcified plaque over the
cavernous segment of the internal carotid arteries as well as along
the right middle cerebral artery and vertebral arteries as well as
basilar artery.
IMPRESSION: No acute intracranial findings.

Chronic ischemic microvascular disease and small old lacunar
infarcts bilaterally.

## 2016-04-17 IMAGING — CR DG CHEST 1V PORT
1 series · 2 of 2 positions shown · non-contrast
Comparison: 06/16/2014

CLINICAL DATA: Followup spontaneous pneumothorax. Pneumonia. COPD.

EXAM:
PORTABLE CHEST - 1 VIEW

[Series 1: ap · 0.17mm/px · 2 of 2 slices shown]
[im 1/2]
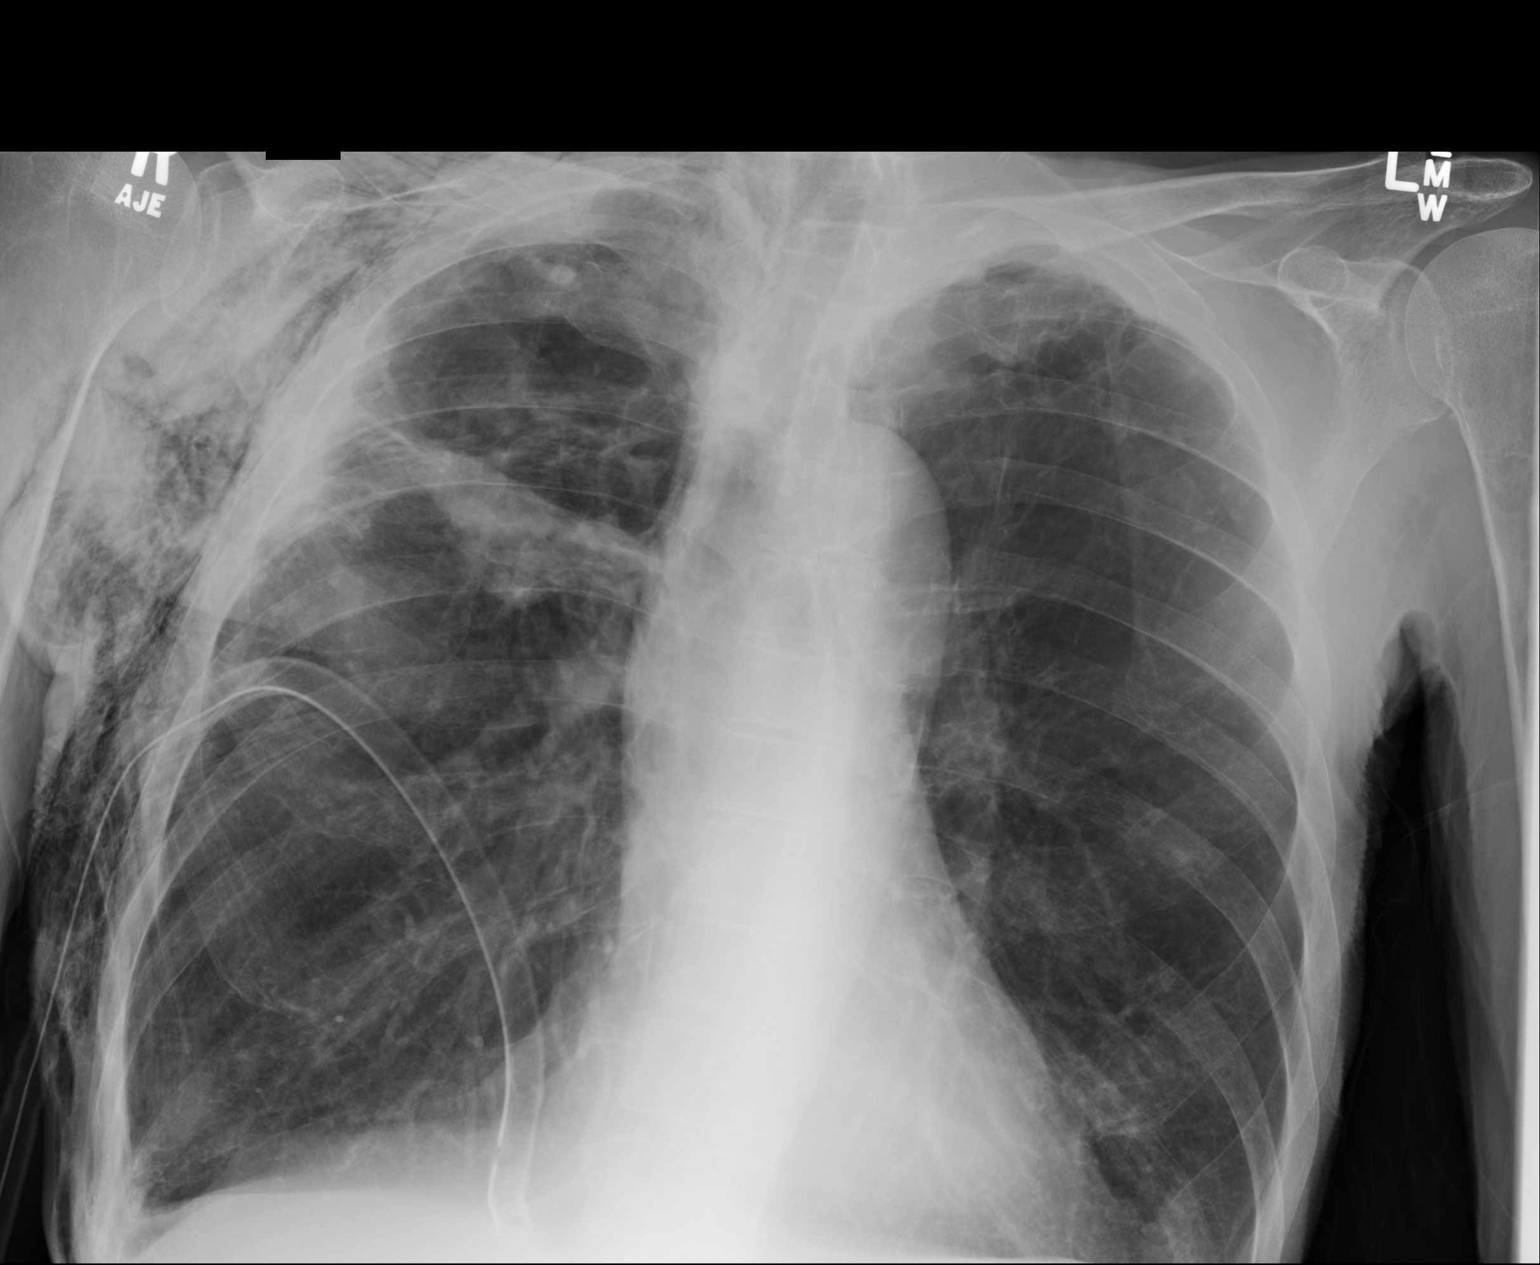
[im 2/2]
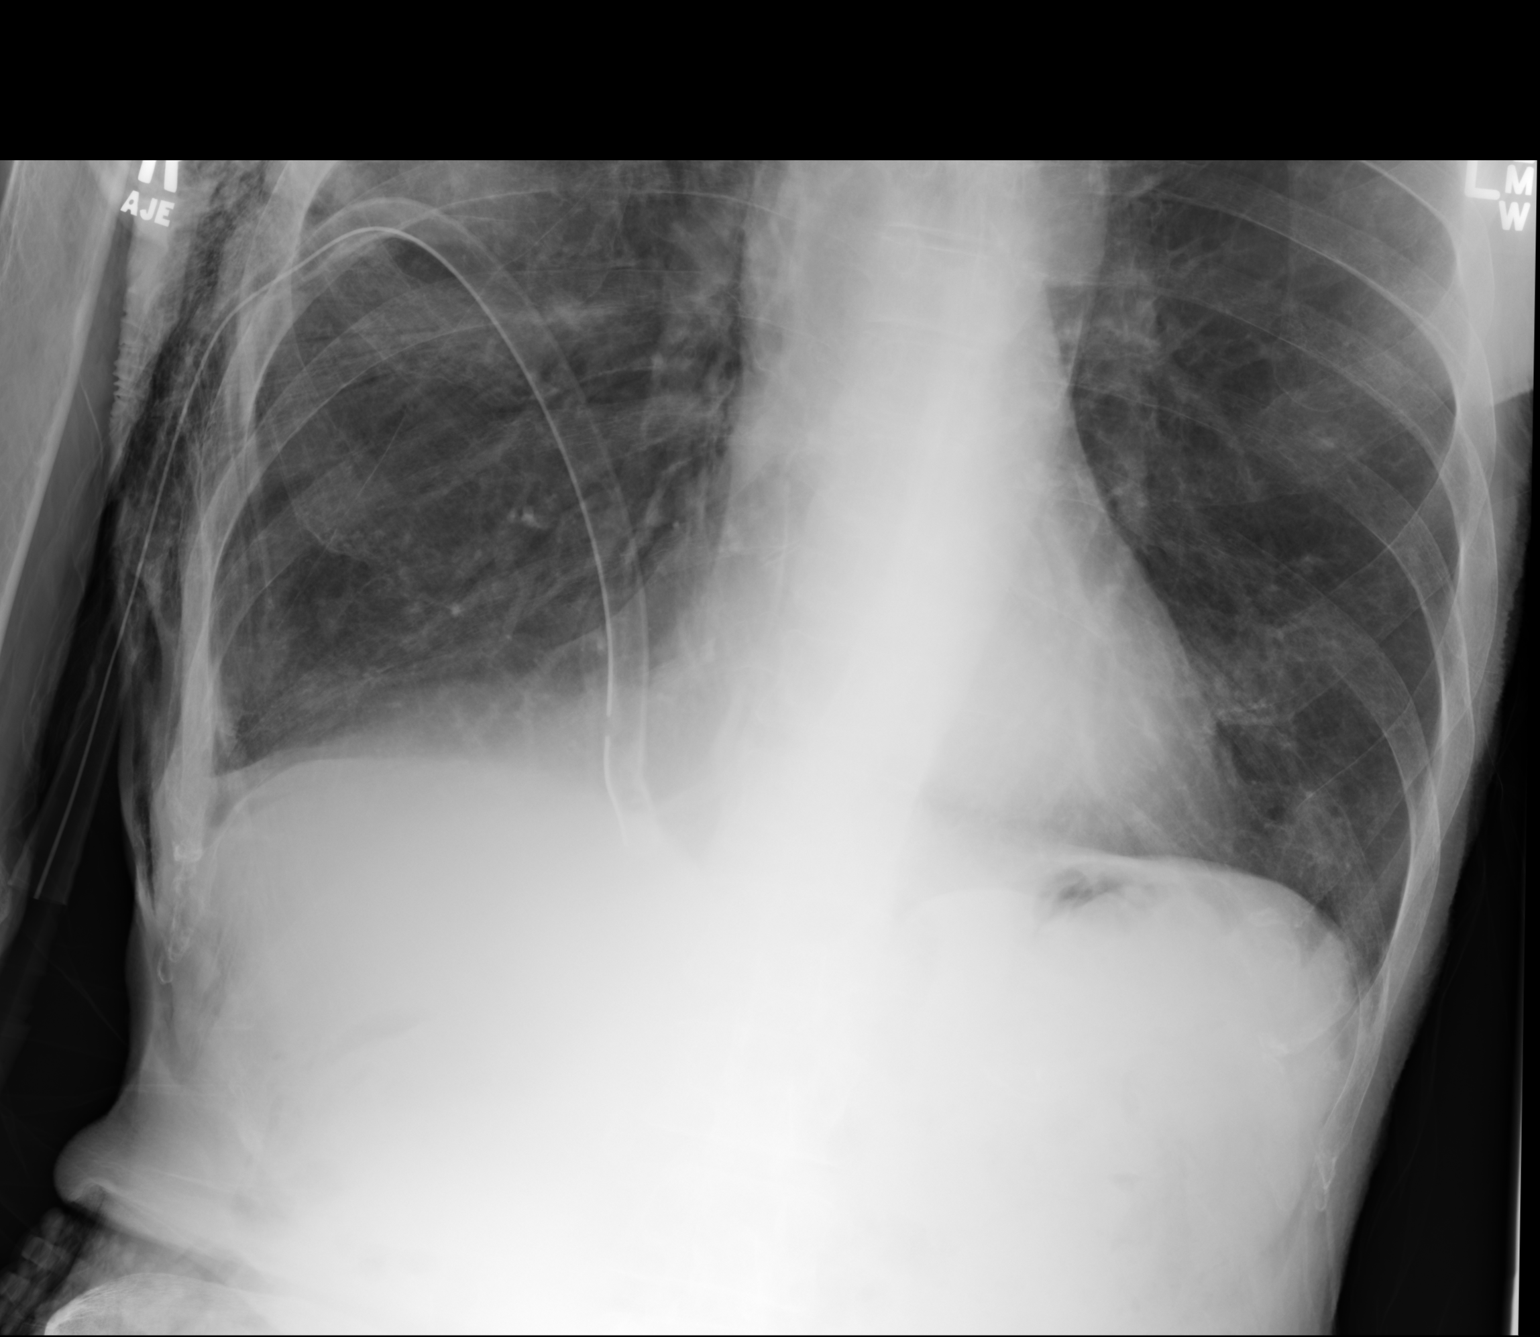

[2 of 2 positions shown; findings below may reference images not displayed]

FINDINGS: Right chest tube remains in place. Extensive subcutaneous emphysema
again seen in the right chest wall, but no definite residual
pneumothorax identified. Changes of COPD are again demonstrated.
Focal consolidation or mass in the right upper lobe shows no
significant change. Heart size is normal.
IMPRESSION: Extensive subcutaneous emphysema in right chest wall, but no
definite residual pneumothorax visualized.

Stable COPD and focal consolidation or mass in the right upper lobe.

## 2016-04-22 ENCOUNTER — Other Ambulatory Visit: Payer: Self-pay | Admitting: Cardiovascular Disease

## 2016-04-26 ENCOUNTER — Encounter
Admission: RE | Disposition: A | Discharge status: Home / Self Care | Payer: Self-pay | Source: Ambulatory Visit | Attending: Cardiovascular Disease

## 2016-04-26 ENCOUNTER — Ambulatory Visit
Admission: RE | Admit: 2016-04-26 | Discharge: 2016-04-26 | Disposition: A | Discharge status: Home / Self Care | Payer: Medicare PPO | Source: Ambulatory Visit | Attending: Cardiovascular Disease | Admitting: Cardiovascular Disease

## 2016-04-26 DIAGNOSIS — I509 Heart failure, unspecified: Secondary | ICD-10-CM | POA: Diagnosis not present

## 2016-04-26 DIAGNOSIS — I4891 Unspecified atrial fibrillation: Secondary | ICD-10-CM | POA: Insufficient documentation

## 2016-04-26 DIAGNOSIS — I279 Pulmonary heart disease, unspecified: Secondary | ICD-10-CM | POA: Diagnosis not present

## 2016-04-26 DIAGNOSIS — R0602 Shortness of breath: Secondary | ICD-10-CM | POA: Insufficient documentation

## 2016-04-26 DIAGNOSIS — Z87891 Personal history of nicotine dependence: Secondary | ICD-10-CM | POA: Diagnosis not present

## 2016-04-26 DIAGNOSIS — E785 Hyperlipidemia, unspecified: Secondary | ICD-10-CM | POA: Insufficient documentation

## 2016-04-26 DIAGNOSIS — Z79899 Other long term (current) drug therapy: Secondary | ICD-10-CM | POA: Diagnosis not present

## 2016-04-26 DIAGNOSIS — I2510 Atherosclerotic heart disease of native coronary artery without angina pectoris: Secondary | ICD-10-CM | POA: Insufficient documentation

## 2016-04-26 DIAGNOSIS — Z8249 Family history of ischemic heart disease and other diseases of the circulatory system: Secondary | ICD-10-CM | POA: Diagnosis not present

## 2016-04-26 HISTORY — DX: Cardiac arrhythmia, unspecified: I49.9

## 2016-04-26 HISTORY — DX: Essential (primary) hypertension: I10

## 2016-04-26 HISTORY — DX: Reserved for inherently not codable concepts without codable children: IMO0001

## 2016-04-26 HISTORY — PX: CARDIAC CATHETERIZATION: SHX172

## 2016-04-26 LAB — BASIC METABOLIC PANEL
ANION GAP: 9 (ref 5–15)
BUN: 17 mg/dL (ref 6–20)
CO2: 27 mmol/L (ref 22–32)
Calcium: 8.5 mg/dL — ABNORMAL LOW (ref 8.9–10.3)
Chloride: 99 mmol/L — ABNORMAL LOW (ref 101–111)
Creatinine, Ser: 0.74 mg/dL (ref 0.61–1.24)
Glucose, Bld: 102 mg/dL — ABNORMAL HIGH (ref 65–99)
POTASSIUM: 3.6 mmol/L (ref 3.5–5.1)
SODIUM: 135 mmol/L (ref 135–145)

## 2016-04-26 LAB — CBC
HEMATOCRIT: 28.4 % — AB (ref 40.0–52.0)
HEMOGLOBIN: 9.9 g/dL — AB (ref 13.0–18.0)
MCH: 30.3 pg (ref 26.0–34.0)
MCHC: 35 g/dL (ref 32.0–36.0)
MCV: 86.8 fL (ref 80.0–100.0)
Platelets: 466 10*3/uL — ABNORMAL HIGH (ref 150–440)
RBC: 3.28 MIL/uL — AB (ref 4.40–5.90)
RDW: 15.2 % — ABNORMAL HIGH (ref 11.5–14.5)
WBC: 14.4 10*3/uL — AB (ref 3.8–10.6)

## 2016-04-26 IMAGING — CR DG CHEST 1V PORT
1 series · 1 of 1 positions shown · non-contrast
Comparison: 06/23/2014.

CLINICAL DATA: Right pneumothorax.

EXAM:
PORTABLE CHEST - 1 VIEW

[ap]
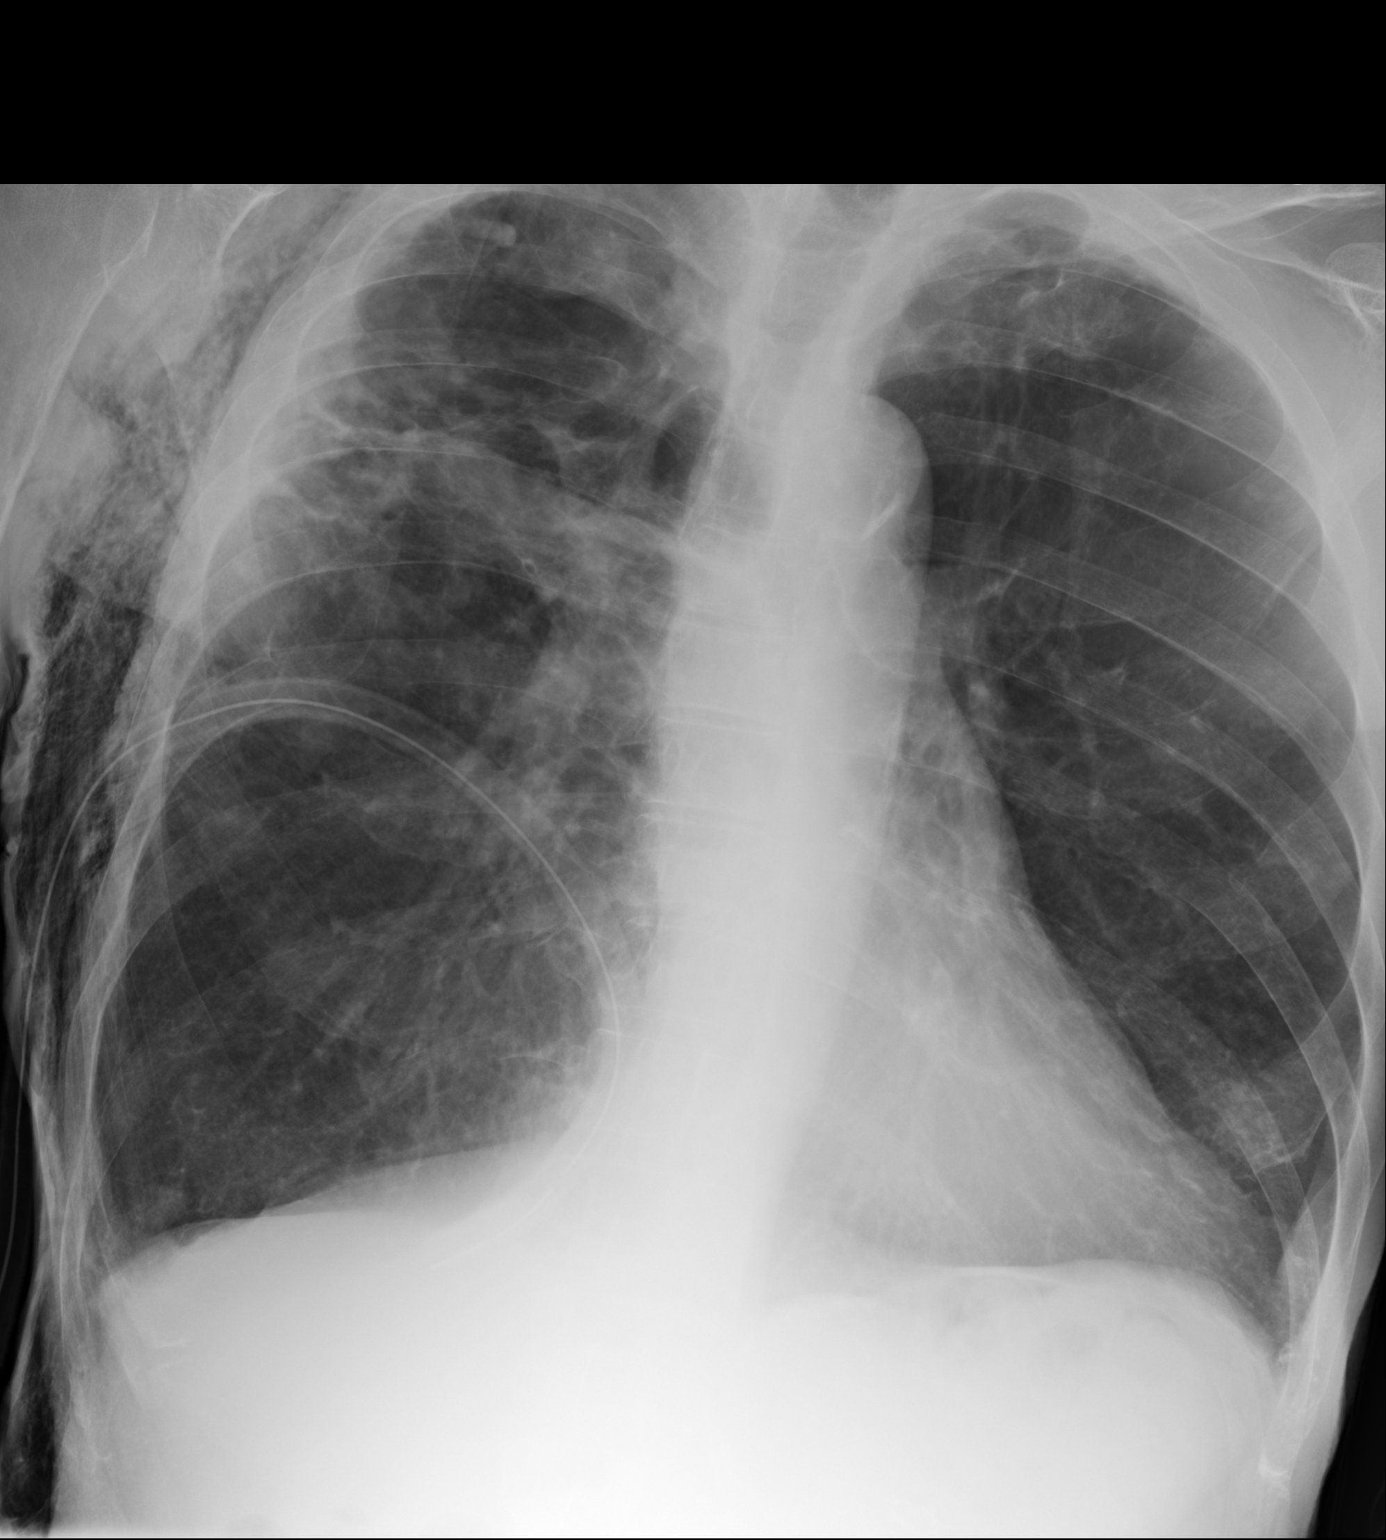

[1 of 1 positions shown; findings below may reference images not displayed]

FINDINGS: There is a right-sided chest tube in place. No evidence for
recurrent right pneumothorax. Stable right chest wall emphysema.
Normal heart size. Extensive cavitary lung disease within the right
upper lobe appears similar to previous exam. No new findings.
IMPRESSION: 1. Stable position of right chest tube without evidence for
recurrent pneumothorax.
2. Persistent right chest wall emphysema.

## 2016-04-26 SURGERY — LEFT HEART CATH AND CORONARY ANGIOGRAPHY
Anesthesia: Moderate Sedation | Laterality: Right

## 2016-04-26 SURGERY — LEFT HEART CATH AND CORONARY ANGIOGRAPHY
Anesthesia: Moderate Sedation | Laterality: Left

## 2016-04-26 MED ORDER — SODIUM CHLORIDE 0.9% FLUSH: 3.0000 mL | INTRAVENOUS | Status: DC | PRN

## 2016-04-26 MED ORDER — HEPARIN (PORCINE) IN NACL 2-0.9 UNIT/ML-% IJ SOLN
INTRAMUSCULAR | Status: AC
  Filled 2016-04-26: qty 500

## 2016-04-26 MED ORDER — SODIUM CHLORIDE 0.9% FLUSH: 3.0000 mL | Freq: Two times a day (BID) | INTRAVENOUS | Status: DC

## 2016-04-26 MED ORDER — FENTANYL CITRATE (PF) 100 MCG/2ML IJ SOLN
INTRAMUSCULAR | Status: DC | PRN
  Administered 2016-04-26: 25 ug via INTRAVENOUS

## 2016-04-26 MED ORDER — SODIUM CHLORIDE 0.9 % WEIGHT BASED INFUSION
3.0000 mL/kg/h | INTRAVENOUS | Status: DC
  Administered 2016-04-26: 3 mL/kg/h via INTRAVENOUS

## 2016-04-26 MED ORDER — SODIUM CHLORIDE 0.9 % IV SOLN: 250.0000 mL | INTRAVENOUS | Status: DC | PRN

## 2016-04-26 MED ORDER — FENTANYL CITRATE (PF) 100 MCG/2ML IJ SOLN
INTRAMUSCULAR | Status: AC
  Filled 2016-04-26: qty 2

## 2016-04-26 MED ORDER — MIDAZOLAM HCL 2 MG/2ML IJ SOLN
INTRAMUSCULAR | Status: DC | PRN
  Administered 2016-04-26 (×2): 0.5 mg via INTRAVENOUS

## 2016-04-26 MED ORDER — IOPAMIDOL (ISOVUE-300) INJECTION 61%
INTRAVENOUS | Status: DC | PRN
  Administered 2016-04-26: 100 mL via INTRA_ARTERIAL

## 2016-04-26 MED ORDER — SODIUM CHLORIDE 0.9 % WEIGHT BASED INFUSION: 1.0000 mL/kg/h | INTRAVENOUS | Status: DC

## 2016-04-26 MED ORDER — KETOROLAC TROMETHAMINE 60 MG/2ML IM SOLN
INTRAMUSCULAR | Status: AC
  Filled 2016-04-26: qty 2

## 2016-04-26 MED ORDER — MIDAZOLAM HCL 2 MG/2ML IJ SOLN
INTRAMUSCULAR | Status: AC
  Filled 2016-04-26: qty 2

## 2016-04-26 MED ORDER — KETOROLAC TROMETHAMINE 15 MG/ML IJ SOLN
15.0000 mg | Freq: Once | INTRAMUSCULAR | Status: AC
  Administered 2016-04-26: 15 mg via INTRAVENOUS
  Filled 2016-04-26: qty 1

## 2016-04-26 MED ORDER — ASPIRIN 81 MG PO CHEW: 81.0000 mg | CHEWABLE_TABLET | ORAL | Status: DC

## 2016-04-26 SURGICAL SUPPLY — 8 items
CATH INFINITI 5FR ANG PIGTAIL (CATHETERS) ×3 IMPLANT
CATH INFINITI 5FR JL4 (CATHETERS) ×3 IMPLANT
CATH INFINITI JR4 5F (CATHETERS) ×3 IMPLANT
KIT MANI 3VAL PERCEP (MISCELLANEOUS) ×3 IMPLANT
NEEDLE PERC 18GX7CM (NEEDLE) ×3 IMPLANT
PACK CARDIAC CATH (CUSTOM PROCEDURE TRAY) ×3 IMPLANT
SHEATH PINNACLE 5F 10CM (SHEATH) ×3 IMPLANT
WIRE EMERALD 3MM-J .035X150CM (WIRE) ×3 IMPLANT

## 2016-04-26 NOTE — Discharge Instructions (Signed)

## 2016-04-29 ENCOUNTER — Other Ambulatory Visit: Payer: Medicare PPO

## 2016-04-29 ENCOUNTER — Ambulatory Visit: Payer: Medicare PPO

## 2016-05-20 ENCOUNTER — Emergency Department: Payer: Medicare PPO

## 2016-05-20 ENCOUNTER — Inpatient Hospital Stay
Admission: EM | Admit: 2016-05-20 | Discharge: 2016-06-09 | DRG: 853 | Disposition: E | Payer: Medicare PPO | Attending: Internal Medicine | Admitting: Internal Medicine

## 2016-05-20 DIAGNOSIS — Z66 Do not resuscitate: Secondary | ICD-10-CM | POA: Diagnosis present

## 2016-05-20 DIAGNOSIS — Z681 Body mass index (BMI) 19 or less, adult: Secondary | ICD-10-CM

## 2016-05-20 DIAGNOSIS — D649 Anemia, unspecified: Secondary | ICD-10-CM | POA: Diagnosis present

## 2016-05-20 DIAGNOSIS — I5021 Acute systolic (congestive) heart failure: Secondary | ICD-10-CM | POA: Diagnosis present

## 2016-05-20 DIAGNOSIS — I110 Hypertensive heart disease with heart failure: Secondary | ICD-10-CM | POA: Diagnosis present

## 2016-05-20 DIAGNOSIS — J159 Unspecified bacterial pneumonia: Secondary | ICD-10-CM | POA: Diagnosis present

## 2016-05-20 DIAGNOSIS — Z515 Encounter for palliative care: Secondary | ICD-10-CM | POA: Diagnosis present

## 2016-05-20 DIAGNOSIS — F17200 Nicotine dependence, unspecified, uncomplicated: Secondary | ICD-10-CM | POA: Diagnosis present

## 2016-05-20 DIAGNOSIS — E43 Unspecified severe protein-calorie malnutrition: Secondary | ICD-10-CM | POA: Diagnosis present

## 2016-05-20 DIAGNOSIS — Z7901 Long term (current) use of anticoagulants: Secondary | ICD-10-CM

## 2016-05-20 DIAGNOSIS — J9801 Acute bronchospasm: Secondary | ICD-10-CM

## 2016-05-20 DIAGNOSIS — T363X6A Underdosing of macrolides, initial encounter: Secondary | ICD-10-CM | POA: Diagnosis present

## 2016-05-20 DIAGNOSIS — J9601 Acute respiratory failure with hypoxia: Secondary | ICD-10-CM | POA: Diagnosis not present

## 2016-05-20 DIAGNOSIS — J9621 Acute and chronic respiratory failure with hypoxia: Secondary | ICD-10-CM | POA: Diagnosis present

## 2016-05-20 DIAGNOSIS — J441 Chronic obstructive pulmonary disease with (acute) exacerbation: Secondary | ICD-10-CM | POA: Diagnosis present

## 2016-05-20 DIAGNOSIS — Z8701 Personal history of pneumonia (recurrent): Secondary | ICD-10-CM

## 2016-05-20 DIAGNOSIS — F419 Anxiety disorder, unspecified: Secondary | ICD-10-CM | POA: Diagnosis present

## 2016-05-20 DIAGNOSIS — Z452 Encounter for adjustment and management of vascular access device: Secondary | ICD-10-CM

## 2016-05-20 DIAGNOSIS — R680 Hypothermia, not associated with low environmental temperature: Secondary | ICD-10-CM | POA: Diagnosis present

## 2016-05-20 DIAGNOSIS — J189 Pneumonia, unspecified organism: Secondary | ICD-10-CM

## 2016-05-20 DIAGNOSIS — I4891 Unspecified atrial fibrillation: Secondary | ICD-10-CM | POA: Diagnosis present

## 2016-05-20 DIAGNOSIS — Z79899 Other long term (current) drug therapy: Secondary | ICD-10-CM

## 2016-05-20 DIAGNOSIS — Z91128 Patient's intentional underdosing of medication regimen for other reason: Secondary | ICD-10-CM | POA: Diagnosis not present

## 2016-05-20 DIAGNOSIS — E785 Hyperlipidemia, unspecified: Secondary | ICD-10-CM | POA: Diagnosis present

## 2016-05-20 DIAGNOSIS — A419 Sepsis, unspecified organism: Principal | ICD-10-CM | POA: Diagnosis present

## 2016-05-20 DIAGNOSIS — J440 Chronic obstructive pulmonary disease with acute lower respiratory infection: Secondary | ICD-10-CM | POA: Diagnosis present

## 2016-05-20 DIAGNOSIS — J9690 Respiratory failure, unspecified, unspecified whether with hypoxia or hypercapnia: Secondary | ICD-10-CM

## 2016-05-20 DIAGNOSIS — R9431 Abnormal electrocardiogram [ECG] [EKG]: Secondary | ICD-10-CM

## 2016-05-20 DIAGNOSIS — R0600 Dyspnea, unspecified: Secondary | ICD-10-CM

## 2016-05-20 DIAGNOSIS — D72829 Elevated white blood cell count, unspecified: Secondary | ICD-10-CM

## 2016-05-20 DIAGNOSIS — I4581 Long QT syndrome: Secondary | ICD-10-CM | POA: Diagnosis present

## 2016-05-20 HISTORY — DX: Heart failure, unspecified: I50.9

## 2016-05-20 LAB — COMPREHENSIVE METABOLIC PANEL
ALBUMIN: 2.7 g/dL — AB (ref 3.5–5.0)
ALT: 17 U/L (ref 17–63)
ANION GAP: 7 (ref 5–15)
AST: 22 U/L (ref 15–41)
Alkaline Phosphatase: 54 U/L (ref 38–126)
BUN: 20 mg/dL (ref 6–20)
CALCIUM: 8.6 mg/dL — AB (ref 8.9–10.3)
CO2: 29 mmol/L (ref 22–32)
Chloride: 99 mmol/L — ABNORMAL LOW (ref 101–111)
Creatinine, Ser: 0.78 mg/dL (ref 0.61–1.24)
GFR calc non Af Amer: >60 mL/min (ref >60)
GLUCOSE: 112 mg/dL — AB (ref 65–99)
POTASSIUM: 4.2 mmol/L (ref 3.5–5.1)
SODIUM: 135 mmol/L (ref 135–145)
TOTAL PROTEIN: 7.3 g/dL (ref 6.5–8.1)

## 2016-05-20 LAB — CBC WITH DIFFERENTIAL/PLATELET
BASOS PCT: 1 %
Basophils Absolute: 0.1 10*3/uL (ref 0–0.1)
EOS ABS: 0.2 10*3/uL (ref 0–0.7)
Eosinophils Relative: 1 %
HEMATOCRIT: 29.1 % — AB (ref 40.0–52.0)
Hemoglobin: 9.5 g/dL — ABNORMAL LOW (ref 13.0–18.0)
LYMPHS ABS: 1.3 10*3/uL (ref 1.0–3.6)
Lymphocytes Relative: 7 %
MCH: 26.8 pg (ref 26.0–34.0)
MCHC: 32.7 g/dL (ref 32.0–36.0)
MCV: 82 fL (ref 80.0–100.0)
MONO ABS: 1.9 10*3/uL — AB (ref 0.2–1.0)
MONOS PCT: 10 %
Neutro Abs: 16.1 10*3/uL — ABNORMAL HIGH (ref 1.4–6.5)
Neutrophils Relative %: 81 %
Platelets: 578 10*3/uL — ABNORMAL HIGH (ref 150–440)
RBC: 3.55 MIL/uL — ABNORMAL LOW (ref 4.40–5.90)
RDW: 15.6 % — AB (ref 11.5–14.5)
WBC: 19.6 10*3/uL — ABNORMAL HIGH (ref 3.8–10.6)

## 2016-05-20 LAB — LACTIC ACID, PLASMA
LACTIC ACID, VENOUS: 1.9 mmol/L (ref 0.5–1.9)
Lactic Acid, Venous: 0.8 mmol/L (ref 0.5–1.9)

## 2016-05-20 LAB — MAGNESIUM
Magnesium: 1.8 mg/dL (ref 1.7–2.4)
Magnesium: 2.4 mg/dL (ref 1.7–2.4)

## 2016-05-20 LAB — CBC
HEMATOCRIT: 25.6 % — AB (ref 40.0–52.0)
Hemoglobin: 8.1 g/dL — ABNORMAL LOW (ref 13.0–18.0)
MCH: 26.6 pg (ref 26.0–34.0)
MCHC: 31.8 g/dL — AB (ref 32.0–36.0)
MCV: 83.7 fL (ref 80.0–100.0)
PLATELETS: 473 10*3/uL — AB (ref 150–440)
RBC: 3.06 MIL/uL — ABNORMAL LOW (ref 4.40–5.90)
RDW: 15.5 % — AB (ref 11.5–14.5)
WBC: 19.1 10*3/uL — AB (ref 3.8–10.6)

## 2016-05-20 LAB — TROPONIN I

## 2016-05-20 LAB — CREATININE, SERUM: Creatinine, Ser: 0.79 mg/dL (ref 0.61–1.24)

## 2016-05-20 LAB — PHOSPHORUS: Phosphorus: 3.4 mg/dL (ref 2.5–4.6)

## 2016-05-20 MED ORDER — VANCOMYCIN HCL IN DEXTROSE 750-5 MG/150ML-% IV SOLN
750.0000 mg | Freq: Two times a day (BID) | INTRAVENOUS | Status: DC
  Administered 2016-05-20 – 2016-05-21 (×2): 750 mg via INTRAVENOUS
  Filled 2016-05-20 (×4): qty 150

## 2016-05-20 MED ORDER — DEXTROMETHORPHAN POLISTIREX ER 30 MG/5ML PO SUER
30.0000 mg | Freq: Two times a day (BID) | ORAL | Status: DC
  Administered 2016-05-20 – 2016-05-23 (×6): 30 mg via ORAL
  Filled 2016-05-20 (×10): qty 5

## 2016-05-20 MED ORDER — SODIUM CHLORIDE 0.9% FLUSH
3.0000 mL | Freq: Two times a day (BID) | INTRAVENOUS | Status: DC
  Administered 2016-05-20 – 2016-05-24 (×7): 3 mL via INTRAVENOUS

## 2016-05-20 MED ORDER — ISONIAZID 300 MG PO TABS
300.0000 mg | ORAL_TABLET | Freq: Every day | ORAL | Status: DC
  Administered 2016-05-20: 300 mg via ORAL
  Filled 2016-05-20: qty 1

## 2016-05-20 MED ORDER — BISACODYL 5 MG PO TBEC: 5.0000 mg | DELAYED_RELEASE_TABLET | Freq: Every day | ORAL | Status: DC | PRN

## 2016-05-20 MED ORDER — ONDANSETRON HCL 4 MG/2ML IJ SOLN: 4.0000 mg | Freq: Four times a day (QID) | INTRAMUSCULAR | Status: DC | PRN

## 2016-05-20 MED ORDER — ISONIAZID 300 MG PO TABS
300.0000 mg | ORAL_TABLET | Freq: Every day | ORAL | Status: DC
  Administered 2016-05-21: 300 mg via ORAL
  Filled 2016-05-20: qty 1

## 2016-05-20 MED ORDER — IOPAMIDOL (ISOVUE-300) INJECTION 61%
75.0000 mL | Freq: Once | INTRAVENOUS | Status: AC | PRN
  Administered 2016-05-20: 75 mL via INTRAVENOUS

## 2016-05-20 MED ORDER — CEFEPIME HCL 1 G IJ SOLR
1.0000 g | Freq: Once | INTRAMUSCULAR | Status: AC
  Administered 2016-05-20: 1 g via INTRAVENOUS
  Filled 2016-05-20: qty 1

## 2016-05-20 MED ORDER — ENOXAPARIN SODIUM 40 MG/0.4ML ~~LOC~~ SOLN: 40.0000 mg | SUBCUTANEOUS | Status: DC

## 2016-05-20 MED ORDER — GUAIFENESIN ER 600 MG PO TB12
600.0000 mg | ORAL_TABLET | Freq: Two times a day (BID) | ORAL | Status: DC
  Administered 2016-05-20 – 2016-05-23 (×6): 600 mg via ORAL
  Filled 2016-05-20 (×6): qty 1

## 2016-05-20 MED ORDER — SENNOSIDES-DOCUSATE SODIUM 8.6-50 MG PO TABS
1.0000 | ORAL_TABLET | Freq: Every evening | ORAL | Status: DC | PRN
  Administered 2016-05-20: 1 via ORAL
  Filled 2016-05-20: qty 1

## 2016-05-20 MED ORDER — VITAMIN B-6 50 MG PO TABS
100.0000 mg | ORAL_TABLET | Freq: Every day | ORAL | Status: DC
  Administered 2016-05-20 – 2016-05-22 (×3): 100 mg via ORAL
  Filled 2016-05-20: qty 2
  Filled 2016-05-20: qty 1
  Filled 2016-05-20: qty 2

## 2016-05-20 MED ORDER — VANCOMYCIN HCL IN DEXTROSE 1-5 GM/200ML-% IV SOLN
1000.0000 mg | Freq: Once | INTRAVENOUS | Status: AC
  Administered 2016-05-20: 1000 mg via INTRAVENOUS
  Filled 2016-05-20: qty 200

## 2016-05-20 MED ORDER — MAGNESIUM CITRATE PO SOLN
1.0000 | Freq: Once | ORAL | Status: DC | PRN
  Filled 2016-05-20: qty 296

## 2016-05-20 MED ORDER — RIVAROXABAN 10 MG PO TABS
20.0000 mg | ORAL_TABLET | Freq: Every day | ORAL | Status: DC
  Administered 2016-05-20 – 2016-05-21 (×2): 20 mg via ORAL
  Filled 2016-05-20 (×2): qty 2

## 2016-05-20 MED ORDER — DIGOXIN 125 MCG PO TABS
0.1250 mg | ORAL_TABLET | Freq: Every day | ORAL | Status: DC
  Administered 2016-05-20 – 2016-05-23 (×4): 0.125 mg via ORAL
  Filled 2016-05-20 (×4): qty 1

## 2016-05-20 MED ORDER — IPRATROPIUM-ALBUTEROL 0.5-2.5 (3) MG/3ML IN SOLN
3.0000 mL | Freq: Once | RESPIRATORY_TRACT | Status: AC
  Administered 2016-05-20: 3 mL via RESPIRATORY_TRACT
  Filled 2016-05-20: qty 3

## 2016-05-20 MED ORDER — ZOLPIDEM TARTRATE 5 MG PO TABS: 5.0000 mg | ORAL_TABLET | Freq: Every evening | ORAL | Status: DC | PRN

## 2016-05-20 MED ORDER — ONDANSETRON HCL 4 MG PO TABS: 4.0000 mg | ORAL_TABLET | Freq: Four times a day (QID) | ORAL | Status: DC | PRN

## 2016-05-20 MED ORDER — CARVEDILOL 3.125 MG PO TABS
3.1250 mg | ORAL_TABLET | Freq: Two times a day (BID) | ORAL | Status: DC
  Administered 2016-05-21 – 2016-05-23 (×2): 3.125 mg via ORAL
  Filled 2016-05-20 (×5): qty 1

## 2016-05-20 MED ORDER — ATORVASTATIN CALCIUM 20 MG PO TABS
80.0000 mg | ORAL_TABLET | Freq: Every day | ORAL | Status: DC
  Administered 2016-05-20 – 2016-05-23 (×4): 80 mg via ORAL
  Filled 2016-05-20 (×4): qty 4

## 2016-05-20 MED ORDER — ALPRAZOLAM 0.25 MG PO TABS
0.2500 mg | ORAL_TABLET | Freq: Every evening | ORAL | Status: DC | PRN
  Administered 2016-05-20 – 2016-05-21 (×2): 0.25 mg via ORAL
  Filled 2016-05-20 (×2): qty 1

## 2016-05-20 MED ORDER — SODIUM CHLORIDE 0.9 % IV SOLN
INTRAVENOUS | Status: DC
  Administered 2016-05-20: 100 mL/h via INTRAVENOUS

## 2016-05-20 MED ORDER — AMIODARONE HCL 200 MG PO TABS: 200.0000 mg | ORAL_TABLET | Freq: Every day | ORAL | Status: DC

## 2016-05-20 MED ORDER — MAGNESIUM SULFATE 2 GM/50ML IV SOLN
2.0000 g | Freq: Once | INTRAVENOUS | Status: AC
  Administered 2016-05-20: 2 g via INTRAVENOUS
  Filled 2016-05-20: qty 50

## 2016-05-20 MED ORDER — CELECOXIB 200 MG PO CAPS
200.0000 mg | ORAL_CAPSULE | Freq: Two times a day (BID) | ORAL | Status: DC
  Administered 2016-05-20 – 2016-05-23 (×6): 200 mg via ORAL
  Filled 2016-05-20 (×6): qty 1

## 2016-05-20 MED ORDER — ACETAMINOPHEN 325 MG PO TABS
650.0000 mg | ORAL_TABLET | Freq: Four times a day (QID) | ORAL | Status: DC | PRN
Start: 2016-05-20 — End: 2016-05-25

## 2016-05-20 MED ORDER — HYDROCODONE-ACETAMINOPHEN 5-325 MG PO TABS: 1.0000 | ORAL_TABLET | ORAL | Status: DC | PRN

## 2016-05-20 MED ORDER — DEXTROSE 5 % IV SOLN
2.0000 g | Freq: Three times a day (TID) | INTRAVENOUS | Status: DC
  Administered 2016-05-20 – 2016-05-22 (×5): 2 g via INTRAVENOUS
  Filled 2016-05-20 (×8): qty 2

## 2016-05-20 MED ORDER — ISONIAZID 300 MG PO TABS
300.0000 mg | ORAL_TABLET | Freq: Every day | ORAL | Status: DC
  Filled 2016-05-20: qty 1

## 2016-05-20 MED ORDER — SODIUM CHLORIDE 0.9 % IV BOLUS (SEPSIS)
1000.0000 mL | Freq: Once | INTRAVENOUS | Status: AC
  Administered 2016-05-20: 1000 mL via INTRAVENOUS

## 2016-05-20 MED ORDER — DM-GUAIFENESIN ER 30-600 MG PO TB12
1.0000 | ORAL_TABLET | Freq: Two times a day (BID) | ORAL | Status: DC
  Filled 2016-05-20: qty 1

## 2016-05-20 MED ORDER — MOMETASONE FURO-FORMOTEROL FUM 200-5 MCG/ACT IN AERO
2.0000 | INHALATION_SPRAY | Freq: Two times a day (BID) | RESPIRATORY_TRACT | Status: DC
  Filled 2016-05-20: qty 8.8

## 2016-05-20 MED ORDER — IPRATROPIUM-ALBUTEROL 0.5-2.5 (3) MG/3ML IN SOLN
3.0000 mL | RESPIRATORY_TRACT | Status: DC | PRN
  Administered 2016-05-21: 3 mL via RESPIRATORY_TRACT
  Filled 2016-05-20: qty 3

## 2016-05-20 MED ORDER — ACETAMINOPHEN 650 MG RE SUPP: 650.0000 mg | Freq: Four times a day (QID) | RECTAL | Status: DC | PRN

## 2016-05-20 MED ORDER — METOPROLOL SUCCINATE ER 50 MG PO TB24: 50.0000 mg | ORAL_TABLET | Freq: Every day | ORAL | Status: DC

## 2016-05-20 NOTE — ED Notes (Signed)
Spoke with EDP regarding pt need for precautions. States that he spoke with Dr. Welton FlakesKhan who confirms that patient does not need to be on any precautions at this time.

## 2016-05-20 NOTE — ED Triage Notes (Signed)
Pt arrives to ER c/o SOB from home. Pt received 5mg  albuterol via EMS. Pt arrives on 2L [place]. [place] [address]80's with EMS. .Marland Kitchen

## 2016-05-20 NOTE — ED Provider Notes (Signed)
[facility] Emergency Department Provider Note    First MD Initiated Contact with Patient 05/19/2016 1314     (approximate)  I have reviewed the triage vital signs and the nursing notes.   HISTORY  Chief Complaint Shortness of Breath    HPI Jeremy Fields is a 70 y.o. male presents with several days of worsening shortness of breath. Patient reports that he was recently treated for pneumonia in July but hasn't felt any better since then. Today patient became acutely short of breath requiring call his brother who then called EMS. Patient admits to productive cough. No recent fevers. No chest pain. States that he still smokes. Denies any recent antibiotics.   Past Medical History:  Diagnosis Date  . Atrial fibrillation (HCC)   . COPD (chronic obstructive pulmonary disease) (HCC)   . Dysrhythmia   . Hypertension   . Shortness of breath dyspnea     Patient Active Problem List   Diagnosis Date Noted  . Pneumonia 02/21/2016    Past Surgical History:  Procedure Laterality Date  . CARDIAC CATHETERIZATION Left 04/26/2016   Procedure: Left Heart Cath and Coronary Angiography;  Surgeon: Laurier Nancy, MD;  Location: [facility] INVASIVE CV LAB;  Service: Cardiovascular;  Laterality: Left;    Prior to Admission medications   Medication Sig Start Date End Date Taking? Authorizing Provider  albuterol (PROVENTIL HFA;VENTOLIN HFA) 108 (90 Base) MCG/ACT inhaler Inhale 2 puffs into the lungs every 6 (six) hours as needed for wheezing or shortness of breath.    Historical Provider, MD  ALPRAZolam Prudy Feeler) 0.25 MG tablet Take 0.25 mg by mouth at bedtime as needed for sleep.    Historical Provider, MD  amiodarone (PACERONE) 200 MG tablet Take 1 tablet (200 mg total) by mouth daily. 02/24/16   Shaune Pollack, MD  amoxicillin-clavulanate (AUGMENTIN) 875-125 MG tablet Take 1 tablet by mouth 2 (two) times daily. Patient not taking: Reported on 04/26/2016 02/24/16   Shaune Pollack, MD  apixaban  (ELIQUIS) 5 MG TABS tablet Take 1 tablet (5 mg total) by mouth 2 (two) times daily. 02/24/16   Shaune Pollack, MD  celecoxib (CELEBREX) 200 MG capsule Take 200 mg by mouth 2 (two) times daily.    Historical Provider, MD  digoxin (LANOXIN) 0.125 MG tablet Take 1 tablet (0.125 mg total) by mouth daily. 02/24/16   Shaune Pollack, MD  Fluticasone-Salmeterol (ADVAIR) 500-50 MCG/DOSE AEPB Inhale 1 puff into the lungs 2 (two) times daily.    Historical Provider, MD  guaiFENesin (ROBITUSSIN) 100 MG/5ML SOLN Take 5 mLs (100 mg total) by mouth every 6 (six) hours as needed for cough or to loosen phlegm. Patient not taking: Reported on 04/26/2016 02/24/16   Shaune Pollack, MD  ipratropium-albuterol (DUONEB) 0.5-2.5 (3) MG/3ML SOLN Take 3 mLs by nebulization every 4 (four) hours as needed (for wheezing/shortness of breath).    Historical Provider, MD  metoprolol succinate (TOPROL-XL) 50 MG 24 hr tablet Take 50 mg by mouth daily. Take with or immediately following a meal.    Historical Provider, MD  mirtazapine (REMERON) 7.5 MG tablet Take 1 tablet (7.5 mg total) by mouth at bedtime. 02/24/16   Shaune Pollack, MD  Polyethyl Glycol-Propyl Glycol (SYSTANE) 0.4-0.3 % SOLN Apply to eye.    Historical Provider, MD  predniSONE (DELTASONE) 10 MG tablet Take 10 mg by mouth as directed. Take 6 tabs daily for 4 days, then 4 tabs daily for 4 days, then 2 tabs daily for 4 days.  Historical Provider, MD  roflumilast (DALIRESP) 500 MCG TABS tablet Take 1 tablet (500 mcg total) by mouth daily. 02/24/16   Shaune PollackQing Chen, MD  Tiotropium Bromide Monohydrate (SPIRIVA RESPIMAT) 2.5 MCG/ACT AERS Inhale 1 puff into the lungs daily. Patient not taking: Reported on 04/26/2016 02/24/16   Shaune PollackQing Chen, MD    Allergies Review of patient's allergies indicates no known allergies.  FMH: no bleeding disorders  Social History Social History  Substance Use Topics  . Smoking status: Current Every Day Smoker  . Smokeless tobacco: Never Used  . Alcohol use No    Review  of Systems Patient denies headaches, rhinorrhea, blurry vision, numbness, shortness of breath, chest pain, edema, cough, abdominal pain, nausea, vomiting, diarrhea, dysuria, fevers, rashes or hallucinations unless otherwise stated above in HPI. ____________________________________________   PHYSICAL EXAM:  VITAL SIGNS: There were no vitals filed for this visit.  Constitutional: Alert and oriented. Moderate respiratory distress Eyes: Conjunctivae are normal. PERRL. EOMI. Head: Atraumatic. Nose: No congestion/rhinnorhea. Mouth/Throat: Mucous membranes are moist.  Oropharynx non-erythematous. Neck: No stridor. Painless ROM. No cervical spine tenderness to palpation Hematological/Lymphatic/Immunilogical: No cervical lymphadenopathy. Cardiovascular: Normal rate, regular rhythm. Grossly normal heart sounds.  Good peripheral circulation. Respiratory: Normal respiratory effort.  No retractions. Prolonged expiratory phase with bilateral expiratory wheezing Gastrointestinal: Soft and nontender. No distention. No abdominal bruits. No CVA tenderness. Genitourinary:  Musculoskeletal: No lower extremity tenderness nor edema.  No joint effusions. Neurologic:  Normal speech and language. No gross focal neurologic deficits are appreciated. No gait instability. Skin:  Skin is warm, dry and intact. No rash noted. Psychiatric: Mood and affect are normal. Speech and behavior are normal.  ____________________________________________   LABS (all labs ordered are listed, but only abnormal results are displayed)  Results for orders placed or performed during the hospital encounter of 06/02/2016 (from the past 24 hour(s))  Troponin I     Status: None   Collection Time: 05/30/2016  1:28 PM  Result Value Ref Range   Troponin I <0.03 <0.03 ng/mL  Magnesium     Status: None   Collection Time: 06/06/2016  1:28 PM  Result Value Ref Range   Magnesium 1.8 1.7 - 2.4 mg/dL  CBC with Differential/Platelet     Status:  Abnormal   Collection Time: 06/02/2016  1:28 PM  Result Value Ref Range   WBC 19.6 (H) 3.8 - 10.6 K/uL   RBC 3.55 (L) 4.40 - 5.90 MIL/uL   Hemoglobin 9.5 (L) 13.0 - 18.0 g/dL   HCT 32.429.1 (L) 40.140.0 - 02.752.0 %   MCV 82.0 80.0 - 100.0 fL   MCH 26.8 26.0 - 34.0 pg   MCHC 32.7 32.0 - 36.0 g/dL   RDW 25.315.6 (H) 66.411.5 - 40.314.5 %   Platelets 578 (H) 150 - 440 K/uL   Neutrophils Relative % 81 %   Neutro Abs 16.1 (H) 1.4 - 6.5 K/uL   Lymphocytes Relative 7 %   Lymphs Abs 1.3 1.0 - 3.6 K/uL   Monocytes Relative 10 %   Monocytes Absolute 1.9 (H) 0.2 - 1.0 K/uL   Eosinophils Relative 1 %   Eosinophils Absolute 0.2 0 - 0.7 K/uL   Basophils Relative 1 %   Basophils Absolute 0.1 0 - 0.1 K/uL  Comprehensive metabolic panel     Status: Abnormal   Collection Time: 05/13/2016  1:28 PM  Result Value Ref Range   Sodium 135 135 - 145 mmol/L   Potassium 4.2 3.5 - 5.1 mmol/L   Chloride 99 (L) 101 -  111 mmol/L   CO2 29 22 - 32 mmol/L   Glucose, Bld 112 (H) 65 - 99 mg/dL   BUN 20 6 - 20 mg/dL   Creatinine, Ser 1.61 0.61 - 1.24 mg/dL   Calcium 8.6 (L) 8.9 - 10.3 mg/dL   Total Protein 7.3 6.5 - 8.1 g/dL   Albumin 2.7 (L) 3.5 - 5.0 g/dL   AST 22 15 - 41 U/L   ALT 17 17 - 63 U/L   Alkaline Phosphatase 54 38 - 126 U/L   Total Bilirubin <0.1 (L) 0.3 - 1.2 mg/dL   GFR calc non Af Amer >60 >60 mL/min   GFR calc Af Amer >60 >60 mL/min   Anion gap 7 5 - 15  Lactic acid, plasma     Status: None   Collection Time: 05/29/2016  2:25 PM  Result Value Ref Range   Lactic Acid, Venous 0.8 0.5 - 1.9 mmol/L   ____________________________________________  EKG My review and personal interpretation at Time: 13:30   Indication: sob  Rate: 95  Rhythm: sinus Axis: right Other: prolonged Qt, non specific ST changes ____________________________________________  RADIOLOGY  CXR IMPRESSION: Confluent alveolar opacities in both upper lobes worrisome for pneumonia which may be due to typical bacterial etiologies or due  to tuberculosis or M. avium intracellulaire. There has been considerable interval deterioration in the appearance of the chest since the July 24th and January 03, 2016 studies. ____________________________________________   PROCEDURES  Procedure(s) performed: none    Critical Care performed: yes CRITICAL CARE Performed by: Willy Eddy   Total critical care time: 35 minutes  Critical care time was exclusive of separately billable procedures and treating other patients.  Critical care was necessary to treat or prevent imminent or life-threatening deterioration.  Critical care was time spent personally by me on the following activities: development of treatment plan with patient and/or surrogate as well as nursing, discussions with consultants, evaluation of patient's response to treatment, examination of patient, obtaining history from patient or surrogate, ordering and performing treatments and interventions, ordering and review of laboratory studies, ordering and review of radiographic studies, pulse oximetry and re-evaluation of patient's condition.  ____________________________________________   INITIAL IMPRESSION / ASSESSMENT AND PLAN / ED COURSE  Pertinent labs & imaging results that were available during my care of the patient were reviewed by me and considered in my medical decision making (see chart for details).  DDX: Asthma, copd, CHF, pna, ptx, malignancy, Pe, anemia   Jeremy Fields is a 70 y.o. who presents to the ED with acute respiratory distress with hypoxia. Patient with complex past medical history including multiple admissions for pneumonia. Patient denies any chest pain. No volume overload. Her exam is more consistent with a pneumonia versus COPD. Patient with improvement after albuterol nebs but still with acute hypoxia.  Based on his ill appearance we'll proceed with septic workup.  The patient will be placed on continuous pulse oximetry and telemetry for  monitoring.  Laboratory evaluation will be sent to evaluate for the above complaints.     Clinical Course  Comment By Time  Patient with significantly elevated white count 19,000. Chest x-ray shows interval development of right apical lobe consolidation as well as persistent left airspace opacity. Given his age and risk factors for malignancy will order CT chest to evaluate for post obstructive pneumonia versus mass. Patient will be started on broad-spectrum antibiotics given his presentation recent antibiotic use. Willy Eddy, MD 09/11 1407  I spoke with Dr. Park Breed, Erline Hau,  who states patient with h/o noncopmpliance with Abx.   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  Agrees with broad spectrum Abx for worsening acute hypoxia .  Will also redose INH. Willy Eddy, MD 09/11 [phone]   ----------------------------------------- 3:58 PM on 05/29/2016 -----------------------------------------  Patient was updated on the results of his laboratory testing. IV magnesium given for prolonged QT. A spoke with Dr. Emmit Pomfret regarding the patient's presentation and need for admission to hospital for further evaluation and management.  Have discussed with the patient and available family all diagnostics and treatments performed thus far and all questions were answered to the best of my ability. The patient demonstrates understanding and agreement with plan.   ____________________________________________   FINAL CLINICAL IMPRESSION(S) / ED DIAGNOSES  Final diagnoses:  HCAP (healthcare-associated pneumonia)  Dyspnea  Acute respiratory failure with hypoxia (HCC)  Leukocytosis  Prolonged Q-T interval on ECG      NEW MEDICATIONS STARTED DURING THIS VISIT:  New Prescriptions   No medications on file     Note:  This document was prepared using Dragon voice recognition software and may include unintentional dictation errors.    Willy Eddy, MD 05/13/2016 [phone]

## 2016-05-20 NOTE — ED Notes (Signed)
Patient transported to X-ray 

## 2016-05-20 NOTE — ED Notes (Signed)
Patient transported to CT 

## 2016-05-20 NOTE — ED Notes (Signed)
Report to Rileylesha, RN on 1A.

## 2016-05-20 NOTE — ED Notes (Signed)
Admitting MD at bedside.

## 2016-05-20 NOTE — Progress Notes (Signed)
Pharmacy Antibiotic Note  Jeremy SantiagoDennis M Fields is a 70 y.o. male admitted on 09/07/2016 with pneumonia/HCAP.  Pharmacy has been consulted for vancomycin and Cefepime dosing. Patient received one dose of Vancomycin 1gm IV and Cefepime 1gm IV in ED.  Plan: Ke: 0.063  T1/2: 11  VD: 40  CrCl: 71  Will start patient on Vancomycin 750mg  IV every 12 hours with 7 hour stack dosing. Calculated trough at Css is 17. Will order trough prior to 5th dose.  Will start patient on Cefepime 2gm IV every 8 hours.   Pharmacy will continue to follow and adjust as needed based on renal function.    Height: 5\' 9"  (175.3 cm) Weight: 125 lb (56.7 kg) IBW/kg (Calculated) : 70.7  Temp (24hrs), Avg:99.1 F (37.3 C), Min:99.1 F (37.3 C), Max:99.1 F (37.3 C)   Recent Labs Lab 2016/06/03 1328 2016/06/03 1425  WBC 19.6*  --   CREATININE 0.78  --   LATICACIDVEN  --  0.8    Estimated Creatinine Clearance: 70.9 mL/min (by C-G formula based on SCr of 0.8 mg/dL).    No Known Allergies  Antimicrobials this admission: 9/11 Vancomycin >>  9/11 Cefepime>>   Dose adjustments this admission:   Microbiology results: 9/11 BCx: pending 9/11 UCx: pending  9/11 Sputum: pending 9/11: Acid Fast Smear: pending  Thank you for allowing pharmacy to be a part of this patient's care.  Cher NakaiSheema Merari Pion, PharmD Clinical Pharmacist 09/07/2016 4:52 PM

## 2016-05-20 NOTE — H&P (Addendum)
[facility] - [place] @ [facility] Admission History and Physical Jeremy Fields, D.O.  ---------------------------------------------------------------------------------------------------------------------   PATIENT NAMEVidur Fields MR#: [id number] DATE OF BIRTH: 17-Feb-1946 DATE OF ADMISSION: 2016/06/11 PRIMARY CARE PHYSICIAN: Lyndon Code, MD  REQUESTING/REFERRING PHYSICIAN: ED Dr. Roxan Hockey  CHIEF COMPLAINT: Chief Complaint  Patient presents with  . Shortness of Breath    HISTORY OF PRESENT ILLNESS: Jeremy Fields is a 70 y.o. male with a known history of A. 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 His family states that for the past week or so he has been lethargic with decreased appetite, decreased by mouth intake, decreased mobility about his home. Today he became acutely short of breath which prompted his brother to call EMS. The patient states that he has a cough which she has been unable to produce sputum. Of note the patient was treated last year in 2015 and 16 for MAC with 9 months of medication. He was  treated for pneumonia just about 2 months ago and discharged home and patient states he had never returned to his baseline following discharge. He is currently being treated for MAC with a plan for 12-18 months. And he had planned to see his pulmonologist today for follow-up.  Otherwise there has been no change in status. Patient has been taking medication as prescribed and there has been no recent change in medication or diet.  There has been no recent illness, travel or sick contacts.    Patient denies fevers/chills, weakness N/V/C/D, abdominal pain, dysuria/frequency, changes in mental status.   EMS/ED COURSE:   Patient received Maxipime and vancomycin.  PAST MEDICAL  HISTORY: Past Medical History:  Diagnosis Date  . Atrial fibrillation (HCC)   . CHF (congestive heart failure) (HCC)   . COPD (chronic obstructive pulmonary disease) (HCC)   . Dysrhythmia   . Hypertension   . Shortness of breath dyspnea       PAST SURGICAL HISTORY: Past Surgical History:  Procedure Laterality Date  . CARDIAC CATHETERIZATION Left 04/26/2016   Procedure: Left Heart Cath and Coronary Angiography;  Surgeon: Laurier Nancy, MD;  Location: [facility] INVASIVE CV LAB;  Service: Cardiovascular;  Laterality: Left;      SOCIAL HISTORY: Social History  Substance Use Topics  . Smoking status: Current Every Day Smoker  . Smokeless tobacco: Never Used  . Alcohol use No      FAMILY HISTORY: No family history on file.   MEDICATIONS AT HOME: Prior to Admission medications   Medication Sig Start Date End Date Taking? Authorizing Provider  albuterol (PROVENTIL HFA;VENTOLIN HFA) 108 (90 Base) MCG/ACT inhaler Inhale 2 puffs into the lungs every 6 (six) hours as needed for wheezing or shortness of breath.   Yes Historical Provider, MD  ALPRAZolam Prudy Feeler) 0.25 MG tablet Take 0.25 mg by mouth at bedtime as needed for sleep.   Yes Historical Provider, MD  amiodarone (PACERONE) 200 MG tablet Take 1 tablet (200 mg total) by mouth daily. 02/24/16  Yes Shaune Pollack, MD  atorvastatin (LIPITOR) 80 MG tablet Take 1 tablet by mouth daily. 04/01/16  Yes Historical Provider, MD  carvedilol (COREG) 3.125 MG tablet Take 1 tablet by mouth 2 (two) times daily. 05/02/16  Yes Historical Provider, MD  celecoxib (CELEBREX) 200 MG capsule Take 200 mg by mouth 2 (two) times daily.  Yes Historical Provider, MD  clarithromycin (BIAXIN) 500 MG tablet Take 1 tablet by mouth 2 (two) times daily. 04/29/16  Yes Historical Provider, MD  digoxin (LANOXIN) 0.125 MG tablet Take 1 tablet (0.125 mg total) by mouth daily. 02/24/16  Yes Shaune PollackQing Chen, MD  Fluticasone-Salmeterol (ADVAIR) 500-50 MCG/DOSE AEPB Inhale 1 puff into the  lungs 2 (two) times daily.   Yes Historical Provider, MD  ipratropium-albuterol (DUONEB) 0.5-2.5 (3) MG/3ML SOLN Take 3 mLs by nebulization every 4 (four) hours as needed (for wheezing/shortness of breath).   Yes Historical Provider, MD  isoniazid (NYDRAZID) 300 MG tablet Take 1 tablet by mouth daily. 04/29/16  Yes Historical Provider, MD  metoprolol succinate (TOPROL-XL) 50 MG 24 hr tablet Take 50 mg by mouth daily. Take with or immediately following a meal.   Yes Historical Provider, MD  mirtazapine (REMERON) 15 MG tablet Take 15 mg by mouth at bedtime.   Yes Historical Provider, MD  Polyethyl Glycol-Propyl Glycol (SYSTANE) 0.4-0.3 % SOLN Apply to eye.   Yes Historical Provider, MD  predniSONE (DELTASONE) 5 MG tablet Take 10 mg by mouth daily. Take 6 tabs daily for 4 days, then 4 tabs daily for 4 days, then 2 tabs daily for 4 days.    Yes Historical Provider, MD  Rivaroxaban (XARELTO PO) Take by mouth.   Yes Historical Provider, MD  roflumilast (DALIRESP) 500 MCG TABS tablet Take 1 tablet (500 mcg total) by mouth daily. Patient not taking: Reported on October 16, 2015 02/24/16   Shaune PollackQing Chen, MD  Tiotropium Bromide Monohydrate (SPIRIVA RESPIMAT) 2.5 MCG/ACT AERS Inhale 1 puff into the lungs daily. Patient not taking: Reported on 04/26/2016 02/24/16   Shaune PollackQing Chen, MD      DRUG ALLERGIES: No Known Allergies   REVIEW OF SYSTEMS: CONSTITUTIONAL: As attentive fever/chills, fatigue, weakness, weight loss. Negative headache EYES: No blurry or double vision. ENT: No tinnitus, postnasal drip, redness or soreness of the oropharynx. RESPIRATORY: Positive cough, wheeze, dyspnea. Negative hemoptysis CARDIOVASCULAR: No chest pain, orthopnea, palpitations, syncope. GASTROINTESTINAL: No nausea, vomiting, constipation, diarrhea, abdominal pain, hematemesis, melena or hematochezia. Positive decreased appetite GENITOURINARY: No dysuria or hematuria. ENDOCRINE: No polyuria or nocturia. No heat or cold  intolerance. HEMATOLOGY: No anemia, bruising, bleeding. INTEGUMENTARY: No rashes, ulcers, lesions. MUSCULOSKELETAL: No arthritis, swelling, gout. NEUROLOGIC: No numbness, tingling, weakness or ataxia. No seizure-type activity. PSYCHIATRIC: No anxiety, depression, insomnia.  PHYSICAL EXAMINATION: VITAL SIGNS: Blood pressure (!) 97/58, pulse 81, temperature 97.8 F (36.6 C), temperature source Oral, resp. rate 20, height 5\' 9"  (1.753 m), weight 56.7 kg (125 lb), SpO2 97 %.  GENERAL: 70 y.o.-year-old white male patient, chronically ill-appearing in no acute distress.  Pleasant and cooperative.   HEENT: Head atraumatic, normocephalic. Pupils equal, round, reactive to light and accommodation. No scleral icterus. Extraocular muscles intact. Nares are patent. Oropharynx is clear. Mucus membranes very dry NECK: Supple, full range of motion. No JVD, no bruit heard. No thyroid enlargement, no tenderness, no cervical lymphadenopathy. CHEST: Normal breath sounds bilaterally. No wheezing, rales, rhonchi or crackles. No use of accessory muscles of respiration.  No reproducible chest wall tenderness.  CARDIOVASCULAR: S1, S2 normal. No murmurs, rubs, or gallops. Cap refill <2 seconds. ABDOMEN: Soft, nontender, nondistended. No rebound, guarding, rigidity. Normoactive bowel sounds present in all four quadrants. No organomegaly or mass. EXTREMITIES: Full range of motion. No pedal edema, cyanosis, or clubbing. NEUROLOGIC: Cranial nerves II through XII are grossly intact with no focal sensorimotor deficit. Muscle strength 5/5 in all extremities. Sensation intact. Gait not checked.  PSYCHIATRIC: The patient is alert and oriented x 3. Normal affect, mood, thought content. SKIN: Warm, dry, and intact without obvious rash, lesion, or ulcer.  LABORATORY PANEL:  CBC  Recent Labs Lab 24-May-2016 1816  WBC 19.1*  HGB 8.1*  HCT 25.6*  PLT 473*    ----------------------------------------------------------------------------------------------------------------- Chemistries  Recent Labs Lab 05-24-2016 1328 2016/05/24 1816  NA 135  --   K 4.2  --   CL 99*  --   CO2 29  --   GLUCOSE 112*  --   BUN 20  --   CREATININE 0.78 0.79  CALCIUM 8.6*  --   MG 1.8 2.4  AST 22  --   ALT 17  --   ALKPHOS 54  --   BILITOT <0.1*  --    ------------------------------------------------------------------------------------------------------------------ Cardiac Enzymes  Recent Labs Lab 05-24-2016 1328  TROPONINI <0.03   ------------------------------------------------------------------------------------------------------------------  RADIOLOGY: Dg Chest 2 View  Result Date: May 24, 2016 CLINICAL DATA:  Severe shortness of breath.  Known COPD EXAM: CHEST  2 VIEW COMPARISON:  Chest x-ray of April 01, 2016 and January 03, 2016. FINDINGS: The lungs are hyperinflated. There are confluent alveolar opacities in both upper lobes. On the right the findings are new and are peripherally located. On the left the findings are not entirely new but are more conspicuous than on the previous exam. The lower lobes and the middle lobe appears spared. The heart and pulmonary vascularity are normal. There is calcification in the wall of the aortic arch. The mediastinum is normal in width. The bony thorax exhibits no acute abnormality. IMPRESSION: Confluent alveolar opacities in both upper lobes worrisome for pneumonia which may be due to typical bacterial etiologies or due to tuberculosis or M. avium intracellulaire. There has been considerable interval deterioration in the appearance of the chest since the July 24th and January 03, 2016 studies. Electronically Signed   By: David  [place] M.D.   On: May 24, 2016 14:10   Ct Chest W Contrast  Result Date: 2016/05/24 CLINICAL DATA:  Acute hypoxic respiratory failure, upper airway consolidation question pneumonia or mass, quit  smoking last week, history COPD, hypertension, atrial fibrillation, CHF EXAM: CT CHEST WITH CONTRAST TECHNIQUE: Multidetector CT imaging of the chest was performed during intravenous contrast administration. Sagittal and coronal MPR images reconstructed from axial data set. Fell CONTRAST:  75mL ISOVUE-300 IOPAMIDOL (ISOVUE-300) INJECTION 61% IV COMPARISON:  06/08/2015 FINDINGS: Cardiovascular: Atherosclerotic calcifications of the aorta and coronary arteries as well as proximal great vessels. 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 Pulmonary arteries grossly patent on nondedicated exam. No pericardial effusion. Significant narrowing of celiac, SMA, and LEFT renal artery origins. Mediastinum/Nodes: Esophagus unremarkable. AP window adenopathy increased, 11 mm short axis node image 51 and 10 mm short axis node image 54. 10 mm short axis precarinal node image 60. 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 Chronic nodularity in the RIGHT middle lobe and lingula again seen. New large area of consolidation in the lateral to posterior RIGHT upper lobe, question undergoing early cavitation. Marked interval increase in size of a cavitary region in the LEFT upper lobe, demonstrating a significantly thickened and irregular wall versus the previous study. Areas of lower attenuation within the LEFT upper lobe may represent necrotic tissue or mycetoma. Multiple new nodular foci in both lungs vertically RIGHT lower lobe up to 9 mm diameter image 131. Increasing peripheral opacity in the lingula as well extending to pleural surface. Chronic nodularity in medial RIGHT lower lobe.  Chronic cavitary opacity RIGHT upper lobe 5.3 x 1.6 x 1.8 cm. Upper Abdomen: Question enlarged periportal lymph node 13 mm short axis on last image 170. Remaining visualized upper abdomen unremarkable. Musculoskeletal: No acute osseous lesions. IMPRESSION: Emphysematous changes with prior  BILATERAL upper lobe scarring and cavitary disease with chronic pulmonary nodularity involving both upper lobes and medial RIGHT lower lobe. Significant interval change with marked interval enlargement of a cavitary region in the LEFT upper lobe demonstrating a thick irregular wall and question either necrotic tissue or mycetoma. Additional new area of consolidation with suspect developing cavitation and necrosis in the lateral RIGHT upper lobe as well. Increased nodularity particularly in LEFT lower lobe. In light of the underlying chronic findings, the interval changes may represent progression of atypical infection with areas of lung necrosis and cavitation though with the thickened irregular wall particularly in the LEFT upper lobe cavitary focus cannot completely exclude tumor. Recommend close pulmonology follow-up to exclude tumor as etiology. Electronically Signed   By: Ulyses Southward M.D.   On: May 23, 2016 16:21    EKG: Sinus at 95 bpm with a rightward axis, prolonged QT interval and nonspecific ST and T wave changes.  IMPRESSION AND PLAN:  This is a 70 y.o. male with a history of  A. fib, COPD, hypertension, recent pneumonia and history of MAC  now being admitted with: 1. Sepsis secondary to HCAP possibly recurrence of MAC with significant interval increase in left upper lobe cavitary lesion and interval development of new consolidation with cavitation and necrosis in the lateral right upper lobe. Changes may repeat some progression of atypical infection but tumor cannot be excluded therefore we'll also consult pulmonary medicine as well as infectious disease. Patient will be continued on Maxipime and vancomycin as well as INH.  2. Acute respiratory failure with hypoxia secondary to #1. Will administer O2, DuoNeb's as needed and telemetry monitoring with continuous pulse ox. 3. QT prolongation, status post magnesium bolus in the emergency department. Monitor on telemetry 4. Atrial  fibrillation-continue Coreg, Pacerone, Xarelto digoxin 5. COPD-continue home regimen including albuterol, Advair, DuoNeb's, steroids, 6. Hypertension-continue Coreg, Toprol 7. Hyperlipidemia-continue Lipitor 8. Poor by mouth intake-we'll request nutrition consult 9. Deconditioning-we'll request physical therapy consult. 10. Anemia, chronic but worse than baseline - will monitor CBC and transfuse if needed.     Diet/Nutrition: Heart healthy Fluids: IV normal saline DVT Px: Xarelto, SCDs and early ambulation Code Status: Full  All the records are reviewed and case discussed with ED provider. Management plans discussed with the patient and/or family who express understanding and agree with plan of care.   TOTAL TIME TAKING CARE OF THIS PATIENT: 60 minutes.   Samanvitha Germany D.O. on May 23, 2016 at 7:57 PM Between 7am to 6pm - Pager - [phone] After 6pm go to [email or web address] - Social research officer, government [facility] [place] [facility] Office [phone] CC: Primary care physician; Lyndon Code, MD     Note: This dictation was prepared with Dragon dictation along with smaller phrase technology. Any transcriptional errors that result from this process are unintentional.

## 2016-05-21 LAB — CBC
HCT: 22.4 % — ABNORMAL LOW (ref 40.0–52.0)
HEMOGLOBIN: 7.4 g/dL — AB (ref 13.0–18.0)
MCH: 27.4 pg (ref 26.0–34.0)
MCHC: 32.8 g/dL (ref 32.0–36.0)
MCV: 83.6 fL (ref 80.0–100.0)
Platelets: 451 10*3/uL — ABNORMAL HIGH (ref 150–440)
RBC: 2.68 MIL/uL — AB (ref 4.40–5.90)
RDW: 15.1 % — ABNORMAL HIGH (ref 11.5–14.5)
WBC: 15.8 10*3/uL — ABNORMAL HIGH (ref 3.8–10.6)

## 2016-05-21 LAB — BASIC METABOLIC PANEL
ANION GAP: 5 (ref 5–15)
BUN: 17 mg/dL (ref 6–20)
CALCIUM: 7.6 mg/dL — AB (ref 8.9–10.3)
CO2: 25 mmol/L (ref 22–32)
Chloride: 105 mmol/L (ref 101–111)
Creatinine, Ser: 0.64 mg/dL (ref 0.61–1.24)
Glucose, Bld: 95 mg/dL (ref 65–99)
POTASSIUM: 4 mmol/L (ref 3.5–5.1)
Sodium: 135 mmol/L (ref 135–145)

## 2016-05-21 LAB — URINALYSIS COMPLETE WITH MICROSCOPIC (ARMC ONLY)
BACTERIA UA: NONE SEEN
Bilirubin Urine: NEGATIVE
Glucose, UA: NEGATIVE mg/dL
HGB URINE DIPSTICK: NEGATIVE
Ketones, ur: NEGATIVE mg/dL
LEUKOCYTES UA: NEGATIVE
NITRITE: NEGATIVE
PH: 6 (ref 5.0–8.0)
Protein, ur: NEGATIVE mg/dL
Specific Gravity, Urine: 1.015 (ref 1.005–1.030)
Squamous Epithelial / LPF: NONE SEEN

## 2016-05-21 LAB — MRSA PCR SCREENING: MRSA by PCR: NEGATIVE

## 2016-05-21 MED ORDER — LIDOCAINE HCL 2 % EX GEL
1.0000 "application " | Freq: Once | CUTANEOUS | Status: DC
  Filled 2016-05-21: qty 5

## 2016-05-21 MED ORDER — BUDESONIDE 0.5 MG/2ML IN SUSP
0.5000 mg | Freq: Two times a day (BID) | RESPIRATORY_TRACT | Status: DC
  Administered 2016-05-21 – 2016-05-22 (×3): 0.5 mg via RESPIRATORY_TRACT
  Filled 2016-05-21 (×3): qty 2

## 2016-05-21 MED ORDER — SODIUM CHLORIDE 0.9 % IV BOLUS (SEPSIS)
500.0000 mL | INTRAVENOUS | Status: DC | PRN
  Administered 2016-05-21: 500 mL via INTRAVENOUS
  Filled 2016-05-21: qty 500

## 2016-05-21 MED ORDER — ENSURE ENLIVE PO LIQD
237.0000 mL | Freq: Three times a day (TID) | ORAL | Status: DC
  Administered 2016-05-21 (×2): 237 mL via ORAL

## 2016-05-21 MED ORDER — SODIUM CHLORIDE 0.9 % IV BOLUS (SEPSIS)
500.0000 mL | Freq: Once | INTRAVENOUS | Status: AC
  Administered 2016-05-21: 500 mL via INTRAVENOUS

## 2016-05-21 MED ORDER — IPRATROPIUM-ALBUTEROL 0.5-2.5 (3) MG/3ML IN SOLN: 3.0000 mL | RESPIRATORY_TRACT | Status: AC

## 2016-05-21 MED ORDER — PHENYLEPHRINE HCL 0.25 % NA SOLN
1.0000 | Freq: Four times a day (QID) | NASAL | Status: DC | PRN
  Filled 2016-05-21: qty 15

## 2016-05-21 MED ORDER — SODIUM CHLORIDE 0.9 % IV BOLUS (SEPSIS): 1000.0000 mL | INTRAVENOUS | Status: DC | PRN

## 2016-05-21 MED ORDER — BUTAMBEN-TETRACAINE-BENZOCAINE 2-2-14 % EX AERO
1.0000 | INHALATION_SPRAY | Freq: Once | CUTANEOUS | Status: DC
  Filled 2016-05-21: qty 20

## 2016-05-21 NOTE — Progress Notes (Signed)
Clinical Child psychotherapistocial Worker (CSW) received consult for "Home care needs." RN Case Manager aware of above. Please reconsult if future social work needs arise. CSW signing off.   Baker Hughes IncorporatedBailey Jubal Rademaker, LCSW [phone]

## 2016-05-21 NOTE — Progress Notes (Signed)
Initial Nutrition Assessment  DOCUMENTATION CODES:   Severe malnutrition in context of acute illness/injury (but malnutrition may be chronic in nature as well)   INTERVENTION:  -Recommend Ensure Enlive po TID, each supplement provides 350 kcal and 20 grams of protein -If po intake inadequate on follow-up, recommend liberalizing diet to Regular  NUTRITION DIAGNOSIS:   Malnutrition related to acute illness as evidenced by moderate depletions of muscle mass, severe depletion of body fat, percent weight loss.  GOAL:   Patient will meet greater than or equal to 90% of their needs  MONITOR:   PO intake, Supplement acceptance, Labs, Weight trends  REASON FOR ASSESSMENT:   Consult Poor PO  ASSESSMENT:   70 yo male admitted with acute on chronic respiratory failure secondary to COPD and pneumonia  Pt reports poor appetite, eating maybe 1 meal per day (small portion) and drinking Ensure TID over the past 3 weeks. Recorded Pt reports 10 pound wt loss over the same time frame (7.4% wt loss in 3 weeks). Reports UBW of 185 pounds years ago, current wt 125 pounds. Pt also reports significant weakness over the past few weeks; sitting on the couch all day and only getting up to go to the bathroom  Nutrition-Focused physical exam completed. Findings are mild/moderate to severe fat depletion, mild/moderate muscle depletion, and no edema.    Past Medical History:  Diagnosis Date  . Atrial fibrillation (HCC)   . CHF (congestive heart failure) (HCC)   . COPD (chronic obstructive pulmonary disease) (HCC)   . Dysrhythmia   . Hypertension   . Shortness of breath dyspnea     Diet Order:  Diet Heart Room service appropriate? Yes; Fluid consistency: Thin   Energy Intake: pt ate double cheeseburger from [employer] for lunch today, ate eggs and bacon for breakfast  Skin:  Reviewed, no issues  Last BM:  9/12   Labs: reviewed  Meds: reviewed  Height:   Ht Readings from Last 1 Encounters:   12-09-2015 5\' 9"  (1.753 m)    Weight:   Wt Readings from Last 1 Encounters:  12-09-2015 125 lb (56.7 kg)    BMI:  Body mass index is 18.46 kg/m.  Estimated Nutritional Needs:   Kcal:  1800-2100 kcals  Protein:  85-100 g  Fluid:  >/= 1.8 L  EDUCATION NEEDS:   No education needs identified at this time  Romelle StarcherCate Isami Mehra MS, RD, LDN [phone] Pager  [phone] Weekend/On-Call Pager

## 2016-05-21 NOTE — Progress Notes (Signed)
Pt's BP=90/45, PR=56, alert and oriented, no complains made. On call MD Sheryle Hailiamond paged and ordered for one time dose NS 500ml bolus to run in 1 hour. Will administer and continue to monitor. Pt was also instructed to collect sputum in the specimen cup provided for testing.

## 2016-05-21 NOTE — Care Management (Signed)
Met with patient to discuss discharge planning. He lives at home alone. Uses O2 chronically through [facility].   Uses no DME. He is usually independent with mobility/drives. Prefers [facility] if he needs home health but they are unable to service this area. List of home care providers given to patient. He has a brother that assists him PRN.

## 2016-05-21 NOTE — Consult Note (Signed)
Pulmonary Critical Care  Initial Consult Note   Jeremy Fields ZOX:[id number] DOB: Apr 12, 1946 DOA: 06/07/2016  Referring physician: Lelon Huh, MD PCP: Lyndon Code, MD   Chief Complaint: Pneumonia  HPI: Jeremy Fields is a 70 y.o. male with prior history of MAI. Patient apparently had  Positive AFB in 2015. Since that time we have placed him on therapy but he has not taken the medications. He states that he has not been taking the medications for the last year. Only last 3 weeks he states has he started to take the meds. Patient presented to the hospital with a 10 day history of declining pulmonary status. Patient has had increased weakness and SOB. He states he has not been able to get around. Patient had a CT scan done which shows progression of upper lobe disease. He also recently was in the hospital for pneumonia. He is noted to have increased WBC. Also has had cough and congestion noted. Patient has no hemoptysis noted. No chest pain noted. He was started on cefepime and vancocin,   Review of Systems:  Constitutional:  +weight loss, no night sweats, Fevers, +fatigue.  HEENT:  No headaches post nasal drip,  Cardio-vascular:  No chest pain, swelling in lower extremities, palpitations  GI:  No heartburn, indigestion, abdominal pain,   Resp:  + shortness of breath.  No coughing up of blood. +wheezing Skin:  no rash or lesions.  Musculoskeletal:  No joint pain or swelling.   Remainder ROS performed and is unremarkable other than noted in HPI  Past Medical History:  Diagnosis Date  . Atrial fibrillation (HCC)   . CHF (congestive heart failure) (HCC)   . COPD (chronic obstructive pulmonary disease) (HCC)   . Dysrhythmia   . Hypertension   . Shortness of breath dyspnea    Past Surgical History:  Procedure Laterality Date  . CARDIAC CATHETERIZATION Left 04/26/2016   Procedure: Left Heart Cath and Coronary Angiography;  Surgeon: Laurier Nancy, MD;  Location: [facility] INVASIVE CV  LAB;  Service: Cardiovascular;  Laterality: Left;   Social History:  reports that he has been smoking.  He has never used smokeless tobacco. He reports that he does not drink alcohol or use drugs.  No Known Allergies  No family history on file.  Prior to Admission medications   Medication Sig Start Date End Date Taking? Authorizing Provider  albuterol (PROVENTIL HFA;VENTOLIN HFA) 108 (90 Base) MCG/ACT inhaler Inhale 2 puffs into the lungs every 6 (six) hours as needed for wheezing or shortness of breath.   Yes Historical Provider, MD  ALPRAZolam Prudy Feeler) 0.25 MG tablet Take 0.25 mg by mouth at bedtime as needed for sleep.   Yes Historical Provider, MD  amiodarone (PACERONE) 200 MG tablet Take 1 tablet (200 mg total) by mouth daily. 02/24/16  Yes Shaune Pollack, MD  atorvastatin (LIPITOR) 80 MG tablet Take 1 tablet by mouth daily. 04/01/16  Yes Historical Provider, MD  carvedilol (COREG) 3.125 MG tablet Take 1 tablet by mouth 2 (two) times daily. 05/02/16  Yes Historical Provider, MD  celecoxib (CELEBREX) 200 MG capsule Take 200 mg by mouth 2 (two) times daily.   Yes Historical Provider, MD  clarithromycin (BIAXIN) 500 MG tablet Take 1 tablet by mouth 2 (two) times daily. 04/29/16  Yes Historical Provider, MD  digoxin (LANOXIN) 0.125 MG tablet Take 1 tablet (0.125 mg total) by mouth daily. 02/24/16  Yes Shaune Pollack, MD  Fluticasone-Salmeterol (ADVAIR) 500-50 MCG/DOSE AEPB Inhale 1 puff into the  lungs 2 (two) times daily.   Yes Historical Provider, MD  ipratropium-albuterol (DUONEB) 0.5-2.5 (3) MG/3ML SOLN Take 3 mLs by nebulization every 4 (four) hours as needed (for wheezing/shortness of breath).   Yes Historical Provider, MD  isoniazid (NYDRAZID) 300 MG tablet Take 1 tablet by mouth daily. 04/29/16  Yes Historical Provider, MD  metoprolol succinate (TOPROL-XL) 50 MG 24 hr tablet Take 50 mg by mouth daily. Take with or immediately following a meal.   Yes Historical Provider, MD  mirtazapine (REMERON) 15 MG  tablet Take 15 mg by mouth at bedtime.   Yes Historical Provider, MD  Polyethyl Glycol-Propyl Glycol (SYSTANE) 0.4-0.3 % SOLN Apply to eye.   Yes Historical Provider, MD  predniSONE (DELTASONE) 5 MG tablet Take 10 mg by mouth daily. Take 6 tabs daily for 4 days, then 4 tabs daily for 4 days, then 2 tabs daily for 4 days.    Yes Historical Provider, MD  Rivaroxaban (XARELTO PO) Take by mouth.   Yes Historical Provider, MD  roflumilast (DALIRESP) 500 MCG TABS tablet Take 1 tablet (500 mcg total) by mouth daily. Patient not taking: Reported on 13-Jun-2016 02/24/16   Shaune Pollack, MD  Tiotropium Bromide Monohydrate (SPIRIVA RESPIMAT) 2.5 MCG/ACT AERS Inhale 1 puff into the lungs daily. Patient not taking: Reported on 04/26/2016 02/24/16   Shaune Pollack, MD   Physical Exam: Vitals:   05/21/16 0441 05/21/16 0636 05/21/16 0759 05/21/16 1123  BP: (!) 90/45 (!) 91/51 (!) 98/56 (!) 102/49  Pulse: 74 78 78 78  Resp: 16   18  Temp: 98 F (36.7 C)  98.3 F (36.8 C) 98 F (36.7 C)  TempSrc: Oral  Oral Oral  SpO2: 95%  98% 100%  Weight:      Height:        Wt Readings from Last 3 Encounters:  06/13/2016 56.7 kg (125 lb)  04/26/16 55.3 kg (122 lb)  02/24/16 55.7 kg (122 lb 11.2 oz)    General:  Appears calm and comfortable Eyes: PERRL, normal lids, irises & conjunctiva ENT: grossly normal hearing, lips & tongue Neck: no LAD, masses or thyromegaly Cardiovascular: RRR, no m/r/g. No LE edema. Respiratory: coarse breath sounds and few rhonchi Normal respiratory effort. Abdomen: soft, nontender Skin: no rash or induration seen on limited exam Musculoskeletal: grossly normal tone BUE/BLE Psychiatric: grossly normal mood and affect Neurologic: grossly non-focal.          Labs on Admission:  Basic Metabolic Panel:  Recent Labs Lab 06/13/16 1328 06-13-16 1816 05/21/16 0439  NA 135  --  135  K 4.2  --  4.0  CL 99*  --  105  CO2 29  --  25  GLUCOSE 112*  --  95  BUN 20  --  17  CREATININE 0.78 0.79  0.64  CALCIUM 8.6*  --  7.6*  MG 1.8 2.4  --   PHOS  --  3.4  --    Liver Function Tests:  Recent Labs Lab 06-13-2016 1328  AST 22  ALT 17  ALKPHOS 54  BILITOT <0.1*  PROT 7.3  ALBUMIN 2.7*   No results for input(s): LIPASE, AMYLASE in the last 168 hours. No results for input(s): AMMONIA in the last 168 hours. CBC:  Recent Labs Lab June 13, 2016 1328 2016/06/13 1816 05/21/16 0439  WBC 19.6* 19.1* 15.8*  NEUTROABS 16.1*  --   --   HGB 9.5* 8.1* 7.4*  HCT 29.1* 25.6* 22.4*  MCV 82.0 83.7 83.6  PLT 578*  473* 451*   Cardiac Enzymes:  Recent Labs Lab 05-30-16 1328  TROPONINI <0.03    BNP (last 3 results) No results for input(s): BNP in the last 8760 hours.  ProBNP (last 3 results) No results for input(s): PROBNP in the last 8760 hours.  CBG: No results for input(s): GLUCAP in the last 168 hours.  Radiological Exams on Admission: Dg Chest 2 View  Result Date: 30-May-2016 CLINICAL DATA:  Severe shortness of breath.  Known COPD EXAM: CHEST  2 VIEW COMPARISON:  Chest x-ray of April 01, 2016 and January 03, 2016. FINDINGS: The lungs are hyperinflated. There are confluent alveolar opacities in both upper lobes. On the right the findings are new and are peripherally located. On the left the findings are not entirely new but are more conspicuous than on the previous exam. The lower lobes and the middle lobe appears spared. The heart and pulmonary vascularity are normal. There is calcification in the wall of the aortic arch. The mediastinum is normal in width. The bony thorax exhibits no acute abnormality. IMPRESSION: Confluent alveolar opacities in both upper lobes worrisome for pneumonia which may be due to typical bacterial etiologies or due to tuberculosis or M. avium intracellulaire. There has been considerable interval deterioration in the appearance of the chest since the July 24th and January 03, 2016 studies. Electronically Signed   By: David  [place] M.D.   On: 2016/05/30 14:10    Ct Chest W Contrast  Result Date: 05/30/2016 CLINICAL DATA:  Acute hypoxic respiratory failure, upper airway consolidation question pneumonia or mass, quit smoking last week, history COPD, hypertension, atrial fibrillation, CHF EXAM: CT CHEST WITH CONTRAST TECHNIQUE: Multidetector CT imaging of the chest was performed during intravenous contrast administration. Sagittal and coronal MPR images reconstructed from axial data set. Fell CONTRAST:  75mL ISOVUE-300 IOPAMIDOL (ISOVUE-300) INJECTION 61% IV COMPARISON:  06/08/2015 FINDINGS: Cardiovascular: Atherosclerotic calcifications of the aorta and coronary arteries as well as proximal great vessels. 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 Pulmonary arteries grossly patent on nondedicated exam. No pericardial effusion. Significant narrowing of celiac, SMA, and LEFT renal artery origins. Mediastinum/Nodes: Esophagus unremarkable. AP window adenopathy increased, 11 mm short axis node image 51 and 10 mm short axis node image 54. 10 mm short axis precarinal node image 60. 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 Chronic nodularity in the RIGHT middle lobe and lingula again seen. New large area of consolidation in the lateral to posterior RIGHT upper lobe, question undergoing early cavitation. Marked interval increase in size of a cavitary region in the LEFT upper lobe, demonstrating a significantly thickened and irregular wall versus the previous study. Areas of lower attenuation within the LEFT upper lobe may represent necrotic tissue or mycetoma. Multiple new nodular foci in both lungs vertically RIGHT lower lobe up to 9 mm diameter image 131. Increasing peripheral opacity in the lingula as well extending to pleural surface. Chronic nodularity in medial RIGHT lower lobe. Chronic cavitary opacity RIGHT upper lobe 5.3 x 1.6 x 1.8 cm. Upper Abdomen: Question enlarged periportal lymph node 13 mm short  axis on last image 170. Remaining visualized upper abdomen unremarkable. Musculoskeletal: No acute osseous lesions. IMPRESSION: Emphysematous changes with prior BILATERAL upper lobe scarring and cavitary disease with chronic pulmonary nodularity involving both upper lobes and medial RIGHT lower lobe. Significant interval change with marked interval enlargement of a cavitary region in the LEFT upper lobe demonstrating a thick irregular wall and question either necrotic tissue or  mycetoma. Additional new area of consolidation with suspect developing cavitation and necrosis in the lateral RIGHT upper lobe as well. Increased nodularity particularly in LEFT lower lobe. In light of the underlying chronic findings, the interval changes may represent progression of atypical infection with areas of lung necrosis and cavitation though with the thickened irregular wall particularly in the LEFT upper lobe cavitary focus cannot completely exclude tumor. Recommend close pulmonology follow-up to exclude tumor as etiology. Electronically Signed   By: Ulyses SouthwardMark  Boles M.D.   On: 02-27-16 16:21   10-21-15    06/08/2015 below    Assessment/Plan Active Problems:   Sepsis due to pneumonia (HCC)   1. Progressive cavitary pneumonia likely due to MAI -he has basically not been taking MAI therapy as prescribed. We have had several discussions with him in the office. Now he presents with progressive disease in the upper lobes -still have to be concerned for a bacterial process however and it may be worthwhile to do a bronch -In the differential I would also be concerned about a neoplastic process also and so will need follow up to resolution  2. Sepsis -currently on treatment with cefepime and vancocin -will schedule bronch for cultures in am -I have discussed case with ID  Code Status: full code  Family Communication: in room  Time spent: 70 min    I have personally obtained a history, examined the patient,  evaluated laboratory and imaging results, formulated the assessment and plan and placed orders.  The Patient requires high complexity decision making for assessment and support.    Yevonne PaxSaadat A Husain Costabile, MD [facility] Center[facility]CC[facility] Pulmonary Critical Care Medicine Sleep Medicine

## 2016-05-21 NOTE — Progress Notes (Signed)
Pt's BP=85/51, PR=59, alert and oriented, no complains of pain or shortness of breath. On call MD Anne HahnWillis paged and ordered for NS 500ml bolus to run in 1 hour. Will administer and continue to monitor.

## 2016-05-21 NOTE — Clinical Social Work Note (Signed)
Clinical Social Work Assessment  Patient Details  Name: Jeremy Fields MRN: [id number] Date of Birth: [birth year]  Date of referral:  05/21/16               Reason for consult:  Facility Placement                Permission sought to share information with:  Chartered certified accountant granted to share information::  No  Name::        Agency::     Relationship::     Contact Information:     Housing/Transportation Living arrangements for the past 2 months:  Single Family Home Source of Information:  Patient Patient Interpreter Needed:  None Criminal Activity/Legal Involvement Pertinent to Current Situation/Hospitalization:  No - Comment as needed Significant Relationships:  Adult Children Lives with:  Self Do you feel safe going back to the place where you live?  Yes Need for family participation in patient care:  No (Coment)  Care giving concerns:  Patient lives in [place] in [place] alone.    Social Worker assessment / plan:  Holiday representative (CSW) reviewed chart and noted that PT is recommending SNF. CSW met with patient alone at bedside. CSW introduced self and explained role of CSW department. Patient was alert and oriented and was laying in the bed. Patient reported that he lives alone in [place] and has 2 daughters that live in [place]. Per patient his daughters Jeremy Fields and Jeremy Fields are his primary support. Patient reported that he is on oxygen at home but not as much as he is on here. CSW explained that PT is recommending SNF. Patient adamantly refused SNF and reported that he is going home. Patient reported that he prefers to go home with [place]. CSW encouraged patient to consider SNF. Patient continued to refuse SNF and reported that he is going to hire someone to assist him. RN Case Manager aware of above. CSW will continue to follow and assist as needed.   Employment status:  Retired Nurse, adult PT  Recommendations:  [place] / Referral to community resources:  [place] (Patient is refusing SNF. )  Patient/Family's Response to care:  Patient refused SNF and is open to home health.   Patient/Family's Understanding of and Emotional Response to Diagnosis, Current Treatment, and Prognosis:  Patient reported that he can manage on his own and refused SNF.   Emotional Assessment Appearance:  Appears stated age Attitude/Demeanor/Rapport:    Affect (typically observed):  Pleasant Orientation:  Oriented to Self, Oriented to Place, Oriented to  Time, Oriented to Situation Alcohol / Substance use:  Not Applicable Psych involvement (Current and /or in the community):  No (Comment)  Discharge Needs  Concerns to be addressed:  Discharge Planning Concerns Readmission within the last 30 days:  No Current discharge risk:  Chronically ill, Dependent with Mobility Barriers to Discharge:  Continued Medical Work up   [employer], Veronia Beets, LCSW 05/21/2016, 11:51 AM

## 2016-05-21 NOTE — Consult Note (Signed)
   [facility]   05/21/2016  Jeremy SantiagoDennis M Fields [birth year] [id number]   Patient screened for potential [facility] Care Management services. Patient is ineligible for [facility] Services related to [facility] [place] not [place]. If patient's post hospital living address changes  please place a [facility] consult related to patient meets referral qualifications except for address. For questions please contact:   Benn Tarver RN, BSN [facility]  Hospital Liaison  [phone]) Business Mobile [phone]) Toll free office

## 2016-05-21 NOTE — Progress Notes (Signed)
[facility] [place] [facility]   PATIENT NAME: Jeremy Fields    MR#:  [id number]  DATE OF BIRTH:  18-Apr-1946  SUBJECTIVE:   Patient here due to shortness of breath and noted to have a pneumonia. Patient CT scan of the chest showing increasing interval change in the left upper lobe cavitary area. Patient has a previous history of MAI. Feeling short of breath this morning, positive cough but not productive.  REVIEW OF SYSTEMS:    Review of Systems  Constitutional: Negative for chills and fever.  HENT: Negative for congestion and tinnitus.   Eyes: Negative for blurred vision and double vision.  Respiratory: Positive for cough and shortness of breath. Negative for wheezing.   Cardiovascular: Negative for chest pain, orthopnea and PND.  Gastrointestinal: Negative for abdominal pain, diarrhea, nausea and vomiting.  Genitourinary: Negative for dysuria and hematuria.  Neurological: Negative for dizziness, sensory change and focal weakness.  All other systems reviewed and are negative.   Nutrition: Heart Healthy Tolerating Diet: Yes Tolerating PT: Await eval.    DRUG ALLERGIES:  No Known Allergies  VITALS:  Blood pressure (!) 102/49, pulse 78, temperature 98 F (36.7 C), temperature source Oral, resp. rate 18, height 5\' 9"  (1.753 m), weight 56.7 kg (125 lb), SpO2 100 %.  PHYSICAL EXAMINATION:   Physical Exam  GENERAL:  70 y.o.-year-old patient sitting up in bed in no acute distress.  EYES: Pupils equal, round, reactive to light and accommodation. No scleral icterus. Extraocular muscles intact.  HEENT: Head atraumatic, normocephalic. Oropharynx and nasopharynx clear.  NECK:  Supple, no jugular venous distention. No thyroid enlargement, no tenderness.  LUNGS: Prolonged insp. & exp. Phase, crackles at bases b/l, no wheezing, rhonchi. No use of accessory muscles of respiration.  CARDIOVASCULAR: S1, S2 normal. No murmurs, rubs, or gallops.  ABDOMEN: Soft,  nontender, nondistended. Bowel sounds present. No organomegaly or mass.  EXTREMITIES: No cyanosis, clubbing or edema b/l.    NEUROLOGIC: Cranial nerves II through XII are intact. No focal Motor or sensory deficits b/l.   PSYCHIATRIC: The patient is alert and oriented x 3.  SKIN: No obvious rash, lesion, or ulcer.    LABORATORY PANEL:   CBC  Recent Labs Lab 05/21/16 0439  WBC 15.8*  HGB 7.4*  HCT 22.4*  PLT 451*   ------------------------------------------------------------------------------------------------------------------  Chemistries   Recent Labs Lab 05/15/2016 1328 06/07/2016 1816 05/21/16 0439  NA 135  --  135  K 4.2  --  4.0  CL 99*  --  105  CO2 29  --  25  GLUCOSE 112*  --  95  BUN 20  --  17  CREATININE 0.78 0.79 0.64  CALCIUM 8.6*  --  7.6*  MG 1.8 2.4  --   AST 22  --   --   ALT 17  --   --   ALKPHOS 54  --   --   BILITOT <0.1*  --   --    ------------------------------------------------------------------------------------------------------------------  Cardiac Enzymes  Recent Labs Lab 05/10/2016 1328  TROPONINI <0.03   ------------------------------------------------------------------------------------------------------------------  RADIOLOGY:  Dg Chest 2 View  Result Date: 05/28/2016 CLINICAL DATA:  Severe shortness of breath.  Known COPD EXAM: CHEST  2 VIEW COMPARISON:  Chest x-ray of April 01, 2016 and January 03, 2016. FINDINGS: The lungs are hyperinflated. There are confluent alveolar opacities in both upper lobes. On the right the findings are new and are peripherally located. On the left the findings are not entirely new  but are more conspicuous than on the previous exam. The lower lobes and the middle lobe appears spared. The heart and pulmonary vascularity are normal. There is calcification in the wall of the aortic arch. The mediastinum is normal in width. The bony thorax exhibits no acute abnormality. IMPRESSION: Confluent alveolar opacities in  both upper lobes worrisome for pneumonia which may be due to typical bacterial etiologies or due to tuberculosis or M. avium intracellulaire. There has been considerable interval deterioration in the appearance of the chest since the July 24th and January 03, 2016 studies. Electronically Signed   By: David  SwazilandJordan M.D.   On: 05/27/2016 14:10   Ct Chest W Contrast  Result Date: 05/12/2016 CLINICAL DATA:  Acute hypoxic respiratory failure, upper airway consolidation question pneumonia or mass, quit smoking last week, history COPD, hypertension, atrial fibrillation, CHF EXAM: CT CHEST WITH CONTRAST TECHNIQUE: Multidetector CT imaging of the chest was performed during intravenous contrast administration. Sagittal and coronal MPR images reconstructed from axial data set. Fell CONTRAST:  75mL ISOVUE-300 IOPAMIDOL (ISOVUE-300) INJECTION 61% IV COMPARISON:  06/08/2015 FINDINGS: Cardiovascular: Atherosclerotic calcifications of the aorta and coronary arteries as well as proximal great vessels. 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 Pulmonary arteries grossly patent on nondedicated exam. No pericardial effusion. Significant narrowing of celiac, SMA, and LEFT renal artery origins. Mediastinum/Nodes: Esophagus unremarkable. AP window adenopathy increased, 11 mm short axis node image 51 and 10 mm short axis node image 54. 10 mm short axis precarinal node image 60. 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 Chronic nodularity in the RIGHT middle lobe and lingula again seen. New large area of consolidation in the lateral to posterior RIGHT upper lobe, question undergoing early cavitation. Marked interval increase in size of a cavitary region in the LEFT upper lobe, demonstrating a significantly thickened and irregular wall versus the previous study. Areas of lower attenuation within the LEFT upper lobe may represent necrotic tissue or mycetoma. Multiple new  nodular foci in both lungs vertically RIGHT lower lobe up to 9 mm diameter image 131. Increasing peripheral opacity in the lingula as well extending to pleural surface. Chronic nodularity in medial RIGHT lower lobe. Chronic cavitary opacity RIGHT upper lobe 5.3 x 1.6 x 1.8 cm. Upper Abdomen: Question enlarged periportal lymph node 13 mm short axis on last image 170. Remaining visualized upper abdomen unremarkable. Musculoskeletal: No acute osseous lesions. IMPRESSION: Emphysematous changes with prior BILATERAL upper lobe scarring and cavitary disease with chronic pulmonary nodularity involving both upper lobes and medial RIGHT lower lobe. Significant interval change with marked interval enlargement of a cavitary region in the LEFT upper lobe demonstrating a thick irregular wall and question either necrotic tissue or mycetoma. Additional new area of consolidation with suspect developing cavitation and necrosis in the lateral RIGHT upper lobe as well. Increased nodularity particularly in LEFT lower lobe. In light of the underlying chronic findings, the interval changes may represent progression of atypical infection with areas of lung necrosis and cavitation though with the thickened irregular wall particularly in the LEFT upper lobe cavitary focus cannot completely exclude tumor. Recommend close pulmonology follow-up to exclude tumor as etiology. Electronically Signed   By: Ulyses SouthwardMark  Boles M.D.   On: 05/19/2016 16:21     ASSESSMENT AND PLAN:   70 year old male with past medical history of tonic respiratory failure, COPD, chronic atrial dilation, CHF, history of previous MAI pneumonia who presented to the hospital due to shortness of breath.  1. Acute  on chronic respiratory failure with hypoxia-secondary to underlying COPD combined with pneumonia. -Continue O2 supplementation, continue broad-spectrum IV antibiotics to cover for pneumonia, continue antibiotics for MAI. -cont. Duonebs, Pulmicort nebs.   2.  Pneumonia - pt. CT chest showing worsening of Left upper lobe cavitary lesion along with possible pneumonia on right upper lobe as well.  - cont. IV Vanco, Cefepime.  - cont. INH, Pyridoxine for hx of MAI.  - await Pulmonary, ID input.   3. Hx of Atrial fibrillation - rate controlled.  Cont. Coreg, Digoxin.  - cont. Xarelto.   4. Anxiety - cont. Xanax.   5. Hyperlipidemia - cont. Atorvastatin.    All the records are reviewed and case discussed with Care Management/Social Worker. Management plans discussed with the patient, family and they are in agreement.  CODE STATUS: Full   DVT Prophylaxis: Xarelto  TOTAL TIME TAKING CARE OF THIS PATIENT: 30 minutes.   POSSIBLE D/C IN 2-3 DAYS, DEPENDING ON CLINICAL CONDITION.   Houston Siren M.D on 05/21/2016 at 1:56 PM  Between 7am to 6pm - Pager - [phone]  After 6pm go to [email or web address] - Scientist, research (life sciences) [facility]  Office  [phone]  CC: Primary care physician; Lyndon Code, MD

## 2016-05-21 NOTE — Evaluation (Signed)
Physical Therapy Evaluation Patient Details Name: Jeremy Fields MRN: [id number] DOB: 24-Aug-1946 Today's Date: 05/21/2016   History of Present Illness  Pt. with hx. pneumonia 2 mo ago, currently admitted for sepsis secondary to pneumonia. Decreased mobilty and worsening SOB in past week. Pt. independent with mobility and on 2L O2 at baseline  Clinical Impression  Pt. Supine, alert and oriented upon arrival. Compliant with tasks requested throughout session, although pt. stated he did not want to sit up in the chair or ambulate out of his room (these tasks eventually deferred due to pt. Tolerance to activity). Able to perform bed mobility with assist of the bed rail, demonstrated stable sitting EOB. Pt. Able to stand with use of RW for B UE with CGA. UE and LE strength grossly 5/5 and symmetrical. Pt able to complete multiple exercise sets EOB and supine. Overall session was limited by continued drop in BP with positional change (100/52 supine, 96/63 sitting, 88/44 upon standing, return to 107/56 with sitting)unable to assess gait or stairs, Pt. spO2 did remain at or above 96% throughout session on 2L O2.  Would benefit from skilled PT to address above deficits and promote optimal return to PLOF would recommend SNF placement upon d/c at this point, will continue to monitor pt. During admission and update PT recommendations as needed.      Follow Up Recommendations SNF    Equipment Recommendations  Rolling walker with 5" wheels    Recommendations for Other Services       Precautions / Restrictions Precautions Precautions: Fall Restrictions Weight Bearing Restrictions: No      Mobility  Bed Mobility Overal bed mobility: Modified Independent             General bed mobility comments: use of bed rail and HOB raised   Transfers Overall transfer level: Needs assistance Equipment used: Rolling walker (2 wheeled) Transfers: Sit to/from Stand Sit to Stand: Min guard             Ambulation/Gait Ambulation/Gait assistance:  (Not assessed due to drop in BP upon standing)           General Gait Details:  (Not assessed due to drop in BP upon standing)  Stairs Stairs:  (Not assessed due to unstable BP readings with chagne in position)          Wheelchair Mobility    Modified Rankin (Stroke Patients Only)       Balance Overall balance assessment: Needs assistance Sitting-balance support: Feet supported;No upper extremity supported Sitting balance-Leahy Scale: Normal     Standing balance support: Bilateral upper extremity supported Standing balance-Leahy Scale: Good                               Pertinent Vitals/Pain Pain Assessment: No/denies pain    Home Living Family/patient expects to be discharged to:: Private residence Living Arrangements: Alone Available Help at Discharge: Family Type of Home: House Home Access: Stairs to enter Entrance Stairs-Rails: None (there is a wall he can reach) Entrance Stairs-Number of Steps: 6 Home Layout: One level Home Equipment: None      Prior Function Level of Independence: Independent         Comments: Pt. reports he is normally independent with mobility and able to perform community ambulation distances without difficutly.     Hand Dominance        Extremity/Trunk Assessment   Upper Extremity Assessment: Overall WFL for tasks  assessed (B UE grossly 5/5 symmetrical)           Lower Extremity Assessment: Overall WFL for tasks assessed (LE strength grossly 5/5 symmetrical )         Communication   Communication: No difficulties  Cognition Arousal/Alertness: Awake/alert Behavior During Therapy: WFL for tasks assessed/performed Overall Cognitive Status: Within Functional Limits for tasks assessed                      General Comments      Exercises Other Exercises Other Exercises: sitting EOB: x10 B LAQ, knee marching, UE PNF flexion. verbal cues for  instruction, sitting rest break to catch breath in between sets Other Exercises: Supine in bed x10 B ankle pumps, heel slides, and SLR.       Assessment/Plan    PT Assessment Patient needs continued PT services  PT Diagnosis Abnormality of gait   PT Problem List Decreased activity tolerance;Decreased balance;Decreased mobility;Decreased knowledge of use of DME  PT Treatment Interventions DME instruction;Gait training;Stair training;Functional mobility training;Therapeutic activities;Therapeutic exercise;Balance training;Patient/family education   PT Goals (Current goals can be found in the Care Plan section) Acute Rehab PT Goals Patient Stated Goal: Pt. would like to be able to return to baseline function  PT Goal Formulation: With patient Time For Goal Achievement: 06/04/16 Potential to Achieve Goals: Fair    Frequency Min 2X/week   Barriers to discharge Decreased caregiver support      Co-evaluation               End of Session Equipment Utilized During Treatment: Gait belt Activity Tolerance: Treatment limited secondary to medical complications (Comment) (BP decreased with sitting and standing positions, further gait, transfer and stair activity deferred ) Patient left: in bed;with call bell/phone within reach;with bed alarm set           Time: [id number] PT Time Calculation (min) (ACUTE ONLY): 27 min   Charges:   PT Evaluation $PT Eval Low Complexity: 1 Procedure PT Treatments $Therapeutic Exercise: 8-22 mins   PT G Codes:        Lael Pilch, SPT  05/21/16,11:31 AM

## 2016-05-21 NOTE — Consult Note (Signed)
[facility] Infectious Disease     Reason for Consult: Possible MAI    Referring Physician: Bobetta Lime Date of Admission:  05/27/2016   Active Problems:   Sepsis due to pneumonia [facility])   HPI: Sheamus Hasting Qadir is a 70 y.o. male admitted with sob, weakness, and cough.  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 Then restarted about 3 weeks ago (apparently was supposed to be on it continuously but decided to restart 3 weeks ago). He cannot recall the medications but admit note states clarithromycin, INH.  His cough is non productive, no fevers, no night sweats.   Past Medical History:  Diagnosis Date  . Atrial fibrillation ([place])   . CHF (congestive heart failure) ([place])   . COPD (chronic obstructive pulmonary disease) ([place])   . Dysrhythmia   . Hypertension   . Shortness of breath dyspnea    Past Surgical History:  Procedure Laterality Date  . CARDIAC CATHETERIZATION Left 04/26/2016   Procedure: Left Heart Cath and Coronary Angiography;  Surgeon: Dionisio Faun Mcqueen, MD;  Location: [facility];  Service: Cardiovascular;  Laterality: Left;   Social History  Substance Use Topics  . Smoking status: Current Every Day Smoker  . Smokeless tobacco: Never Used  . Alcohol use No   No family history on file.  Allergies: No Known Allergies  Current antibiotics: Antibiotics Given (last 72 hours)    Date/Time Action Medication Dose Rate   05/30/2016 1607 Given   isoniazid (NYDRAZID) tablet 300 mg 300 mg    05/18/2016 2232 Given   ceFEPIme (MAXIPIME) 2 g in dextrose 5 % 50 mL IVPB 2 g 100 mL/hr   05/24/2016 2236 Given   vancomycin (VANCOCIN) IVPB 750 mg/150 ml premix 750 mg 150 mL/hr   05/21/16 0517 Given   ceFEPIme (MAXIPIME) 2 g in dextrose 5 % 50 mL IVPB 2 g 100 mL/hr   05/21/16 0931 Given   isoniazid (NYDRAZID) tablet 300 mg 300 mg    05/21/16 0932 Given   vancomycin (VANCOCIN) IVPB 750 mg/150 ml premix 750  mg 150 mL/hr   05/21/16 1411 Given   ceFEPIme (MAXIPIME) 2 g in dextrose 5 % 50 mL IVPB 2 g 100 mL/hr      MEDICATIONS: . atorvastatin  80 mg Oral Daily  . budesonide (PULMICORT) nebulizer solution  0.5 mg Nebulization BID  . carvedilol  3.125 mg Oral BID  . ceFEPime (MAXIPIME) IV  2 g Intravenous Q8H  . celecoxib  200 mg Oral BID  . dextromethorphan  30 mg Oral BID   And  . guaiFENesin  600 mg Oral BID  . digoxin  0.125 mg Oral Daily  . feeding supplement (ENSURE ENLIVE)  237 mL Oral TID BM  . ipratropium-albuterol  3 mL Nebulization STAT  . isoniazid  300 mg Oral Daily  . pyridOXINE  100 mg Oral Daily  . rivaroxaban  20 mg Oral Daily  . sodium chloride flush  3 mL Intravenous Q12H  . vancomycin  750 mg Intravenous Q12H    Review of Systems - 11 systems reviewed and negative per HPI   OBJECTIVE: Temp:  [97.8 F (36.6 C)-98.3 F (36.8 C)] 98 F (36.7 C) (09/12 1123) Pulse Rate:  [73-85] 78 (09/12 1123) Resp:  [16-25] 18 (09/12 1123) BP: (85-107)/(45-91) 102/49 (09/12 1123) SpO2:  [94 %-100 %] 100 % (09/12 1123) Physical Exam  Constitutional: He is oriented  to person, place, and time.thin, frail, on o2, not in resp distress HENT: anicteric Mouth/Throat: Oropharynx is clear and moist. No oropharyngeal exudate.  Cardiovascular: Normal rate, regular rhythm and normal heart sounds.  Pulmonary/Chest:poor air movement, bronchial bsAbdominal: Soft. Bowel sounds are normal. He exhibits no distension. There is no tenderness.  Lymphadenopathy: He has no cervical adenopathy.  Neurological: He is alert and oriented to person, place, and time.  Skin: Skin is warm and dry. No rash noted. No erythema.  Psychiatric: He has a normal mood and affect. His behavior is normal.   LABS: Results for orders placed or performed during the hospital encounter of 05/19/2016 (from the past 48 hour(s))  Troponin I     Status: None   Collection Time: 05/16/2016  1:28 PM  Result Value Ref Range    Troponin I <0.03 <0.03 ng/mL  Magnesium     Status: None   Collection Time: 06/02/2016  1:28 PM  Result Value Ref Range   Magnesium 1.8 1.7 - 2.4 mg/dL  CBC with Differential/Platelet     Status: Abnormal   Collection Time: 05/14/2016  1:28 PM  Result Value Ref Range   WBC 19.6 (H) 3.8 - 10.6 K/uL   RBC 3.55 (L) 4.40 - 5.90 MIL/uL   Hemoglobin 9.5 (L) 13.0 - 18.0 g/dL   HCT 29.1 (L) 40.0 - 52.0 %   MCV 82.0 80.0 - 100.0 fL   MCH 26.8 26.0 - 34.0 pg   MCHC 32.7 32.0 - 36.0 g/dL   RDW 15.6 (H) 11.5 - 14.5 %   Platelets 578 (H) 150 - 440 K/uL   Neutrophils Relative % 81 %   Neutro Abs 16.1 (H) 1.4 - 6.5 K/uL   Lymphocytes Relative 7 %   Lymphs Abs 1.3 1.0 - 3.6 K/uL   Monocytes Relative 10 %   Monocytes Absolute 1.9 (H) 0.2 - 1.0 K/uL   Eosinophils Relative 1 %   Eosinophils Absolute 0.2 0 - 0.7 K/uL   Basophils Relative 1 %   Basophils Absolute 0.1 0 - 0.1 K/uL  Comprehensive metabolic panel     Status: Abnormal   Collection Time: 06/01/2016  1:28 PM  Result Value Ref Range   Sodium 135 135 - 145 mmol/L   Potassium 4.2 3.5 - 5.1 mmol/L   Chloride 99 (L) 101 - 111 mmol/L   CO2 29 22 - 32 mmol/L   Glucose, Bld 112 (H) 65 - 99 mg/dL   BUN 20 6 - 20 mg/dL   Creatinine, Ser 0.78 0.61 - 1.24 mg/dL   Calcium 8.6 (L) 8.9 - 10.3 mg/dL   Total Protein 7.3 6.5 - 8.1 g/dL   Albumin 2.7 (L) 3.5 - 5.0 g/dL   AST 22 15 - 41 U/L   ALT 17 17 - 63 U/L   Alkaline Phosphatase 54 38 - 126 U/L   Total Bilirubin <0.1 (L) 0.3 - 1.2 mg/dL   GFR calc non Af Amer >60 >60 mL/min   GFR calc Af Amer >60 >60 mL/min    Comment: (NOTE) The eGFR has been calculated using the CKD EPI equation. This calculation has not been validated in all clinical situations. eGFR's persistently <60 mL/min signify possible Chronic Kidney Disease.    Anion gap 7 5 - 15  Lactic acid, plasma     Status: None   Collection Time: 05/31/2016  2:25 PM  Result Value Ref Range   Lactic Acid, Venous 0.8 0.5 - 1.9 mmol/L  Blood  Culture (routine x  2)     Status: None (Preliminary result)   Collection Time: 05/12/2016  2:25 PM  Result Value Ref Range   Specimen Description BLOOD AFA    Special Requests      BOTTLES DRAWN AEROBIC AND ANAEROBIC  AERO 8CC ANA6CC   Culture NO GROWTH < 24 HOURS    Report Status PENDING   Blood Culture (routine x 2)     Status: None (Preliminary result)   Collection Time: 06/01/2016  2:25 PM  Result Value Ref Range   Specimen Description BLOOD  L AC    Special Requests BOTTLES DRAWN AEROBIC AND ANAEROBIC 8CC    Culture NO GROWTH < 24 HOURS    Report Status PENDING   Lactic acid, plasma     Status: None   Collection Time: 05/13/2016  6:16 PM  Result Value Ref Range   Lactic Acid, Venous 1.9 0.5 - 1.9 mmol/L  CBC     Status: Abnormal   Collection Time: 05/16/2016  6:16 PM  Result Value Ref Range   WBC 19.1 (H) 3.8 - 10.6 K/uL   RBC 3.06 (L) 4.40 - 5.90 MIL/uL   Hemoglobin 8.1 (L) 13.0 - 18.0 g/dL   HCT 25.6 (L) 40.0 - 52.0 %   MCV 83.7 80.0 - 100.0 fL   MCH 26.6 26.0 - 34.0 pg   MCHC 31.8 (L) 32.0 - 36.0 g/dL   RDW 15.5 (H) 11.5 - 14.5 %   Platelets 473 (H) 150 - 440 K/uL  Creatinine, serum     Status: None   Collection Time: 05/27/2016  6:16 PM  Result Value Ref Range   Creatinine, Ser 0.79 0.61 - 1.24 mg/dL   GFR calc non Af Amer >60 >60 mL/min   GFR calc Af Amer >60 >60 mL/min    Comment: (NOTE) The eGFR has been calculated using the CKD EPI equation. This calculation has not been validated in all clinical situations. eGFR's persistently <60 mL/min signify possible Chronic Kidney Disease.   Magnesium     Status: None   Collection Time: 06/03/2016  6:16 PM  Result Value Ref Range   Magnesium 2.4 1.7 - 2.4 mg/dL  Phosphorus     Status: None   Collection Time: 05/23/2016  6:16 PM  Result Value Ref Range   Phosphorus 3.4 2.5 - 4.6 mg/dL  Urinalysis complete, with microscopic ([facility] only)     Status: Abnormal   Collection Time: 05/23/2016 11:47 PM  Result Value Ref Range   Color,  Urine YELLOW (A) YELLOW   APPearance CLEAR (A) CLEAR   Glucose, UA NEGATIVE NEGATIVE mg/dL   Bilirubin Urine NEGATIVE NEGATIVE   Ketones, ur NEGATIVE NEGATIVE mg/dL   Specific Gravity, Urine 1.015 1.005 - 1.030   Hgb urine dipstick NEGATIVE NEGATIVE   pH 6.0 5.0 - 8.0   Protein, ur NEGATIVE NEGATIVE mg/dL   Nitrite NEGATIVE NEGATIVE   Leukocytes, UA NEGATIVE NEGATIVE   RBC / HPF 0-5 0 - 5 RBC/hpf   WBC, UA 0-5 0 - 5 WBC/hpf   Bacteria, UA NONE SEEN NONE SEEN   Squamous Epithelial / LPF NONE SEEN NONE SEEN  Basic metabolic panel     Status: Abnormal   Collection Time: 05/21/16  4:39 AM  Result Value Ref Range   Sodium 135 135 - 145 mmol/L   Potassium 4.0 3.5 - 5.1 mmol/L   Chloride 105 101 - 111 mmol/L   CO2 25 22 - 32 mmol/L   Glucose, Bld 95 65 - 99  mg/dL   BUN 17 6 - 20 mg/dL   Creatinine, Ser 0.64 0.61 - 1.24 mg/dL   Calcium 7.6 (L) 8.9 - 10.3 mg/dL   GFR calc non Af Amer >60 >60 mL/min   GFR calc Af Amer >60 >60 mL/min    Comment: (NOTE) The eGFR has been calculated using the CKD EPI equation. This calculation has not been validated in all clinical situations. eGFR's persistently <60 mL/min signify possible Chronic Kidney Disease.    Anion gap 5 5 - 15  CBC     Status: Abnormal   Collection Time: 05/21/16  4:39 AM  Result Value Ref Range   WBC 15.8 (H) 3.8 - 10.6 K/uL   RBC 2.68 (L) 4.40 - 5.90 MIL/uL   Hemoglobin 7.4 (L) 13.0 - 18.0 g/dL   HCT 22.4 (L) 40.0 - 52.0 %   MCV 83.6 80.0 - 100.0 fL   MCH 27.4 26.0 - 34.0 pg   MCHC 32.8 32.0 - 36.0 g/dL   RDW 15.1 (H) 11.5 - 14.5 %   Platelets 451 (H) 150 - 440 K/uL  MRSA PCR Screening     Status: None   Collection Time: 05/21/16 11:00 AM  Result Value Ref Range   MRSA by PCR NEGATIVE NEGATIVE    Comment:        The GeneXpert MRSA Assay (FDA approved for NASAL specimens only), is one component of a comprehensive MRSA colonization surveillance program. It is not intended to diagnose MRSA infection nor to guide  or monitor treatment for MRSA infections.    No components found for: ESR, C REACTIVE PROTEIN MICRO: Recent Results (from the past 720 hour(s))  Blood Culture (routine x 2)     Status: None (Preliminary result)   Collection Time: 05/15/2016  2:25 PM  Result Value Ref Range Status   Specimen Description BLOOD AFA  Final   Special Requests   Final    BOTTLES DRAWN AEROBIC AND ANAEROBIC  AERO 8CC ANA6CC   Culture NO GROWTH < 24 HOURS  Final   Report Status PENDING  Incomplete  Blood Culture (routine x 2)     Status: None (Preliminary result)   Collection Time: 05/31/2016  2:25 PM  Result Value Ref Range Status   Specimen Description BLOOD  L AC  Final   Special Requests BOTTLES DRAWN AEROBIC AND ANAEROBIC 8CC  Final   Culture NO GROWTH < 24 HOURS  Final   Report Status PENDING  Incomplete  MRSA PCR Screening     Status: None   Collection Time: 05/21/16 11:00 AM  Result Value Ref Range Status   MRSA by PCR NEGATIVE NEGATIVE Final    Comment:        The GeneXpert MRSA Assay (FDA approved for NASAL specimens only), is one component of a comprehensive MRSA colonization surveillance program. It is not intended to diagnose MRSA infection nor to guide or monitor treatment for MRSA infections.     IMAGING: Dg Chest 2 View  Result Date: 05/31/2016 CLINICAL DATA:  Severe shortness of breath.  Known COPD EXAM: CHEST  2 VIEW COMPARISON:  Chest x-ray of April 01, 2016 and January 03, 2016. FINDINGS: The lungs are hyperinflated. There are confluent alveolar opacities in both upper lobes. On the right the findings are new and are peripherally located. On the left the findings are not entirely new but are more conspicuous than on the previous exam. The lower lobes and the middle lobe appears spared. The heart and pulmonary vascularity  are normal. There is calcification in the wall of the aortic arch. The mediastinum is normal in width. The bony thorax exhibits no acute abnormality. IMPRESSION:  Confluent alveolar opacities in both upper lobes worrisome for pneumonia which may be due to typical bacterial etiologies or due to tuberculosis or M. avium intracellulaire. There has been considerable interval deterioration in the appearance of the chest since the July 24th and January 03, 2016 studies. Electronically Signed   By: Jaslynne Dahan  [place] M.D.   On: 05/11/2016 14:10   Ct Chest W Contrast  Result Date: 05/31/2016 CLINICAL DATA:  Acute hypoxic respiratory failure, upper airway consolidation question pneumonia or mass, quit smoking last week, history COPD, hypertension, atrial fibrillation, CHF EXAM: CT CHEST WITH CONTRAST TECHNIQUE: Multidetector CT imaging of the chest was performed during intravenous contrast administration. Sagittal and coronal MPR images reconstructed from axial data set. Fell CONTRAST:  28m ISOVUE-300 IOPAMIDOL (ISOVUE-300) INJECTION 61% IV COMPARISON:  06/08/2015 FINDINGS: Cardiovascular: Atherosclerotic calcifications of the aorta and coronary arteries as well as proximal great vessels. 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 Pulmonary arteries grossly patent on nondedicated exam. No pericardial effusion. Significant narrowing of celiac, SMA, and LEFT renal artery origins. Mediastinum/Nodes: Esophagus unremarkable. AP window adenopathy increased, 11 mm short axis node image 51 and 10 mm short axis node image 54. 10 mm short axis precarinal node image 60. 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 Chronic nodularity in the RIGHT middle lobe and lingula again seen. New large area of consolidation in the lateral to posterior RIGHT upper lobe, question undergoing early cavitation. Marked interval increase in size of a cavitary region in the LEFT upper lobe, demonstrating a significantly thickened and irregular wall versus the previous study. Areas of lower attenuation within the LEFT upper lobe may represent necrotic  tissue or mycetoma. Multiple new nodular foci in both lungs vertically RIGHT lower lobe up to 9 mm diameter image 131. Increasing peripheral opacity in the lingula as well extending to pleural surface. Chronic nodularity in medial RIGHT lower lobe. Chronic cavitary opacity RIGHT upper lobe 5.3 x 1.6 x 1.8 cm. Upper Abdomen: Question enlarged periportal lymph node 13 mm short axis on last image 170. Remaining visualized upper abdomen unremarkable. Musculoskeletal: No acute osseous lesions. IMPRESSION: Emphysematous changes with prior BILATERAL upper lobe scarring and cavitary disease with chronic pulmonary nodularity involving both upper lobes and medial RIGHT lower lobe. Significant interval change with marked interval enlargement of a cavitary region in the LEFT upper lobe demonstrating a thick irregular wall and question either necrotic tissue or mycetoma. Additional new area of consolidation with suspect developing cavitation and necrosis in the lateral RIGHT upper lobe as well. Increased nodularity particularly in LEFT lower lobe. In light of the underlying chronic findings, the interval changes may represent progression of atypical infection with areas of lung necrosis and cavitation though with the thickened irregular wall particularly in the LEFT upper lobe cavitary focus cannot completely exclude tumor. Recommend close pulmonology follow-up to exclude tumor as etiology. Electronically Signed   By: MLavonia DanaM.D.   On: 05/18/2016 16:21    Assessment:   DKani ChauvinWard is a 70y.o. 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 I suspect he has an acute bacterial superinfection causing the impressive changes on CT and  current decline and the MAI is causing the more chronic changes along with ongoing  tobacco abuse.  I spoke with Dr Humphrey Rolls and he agrees and will bronch tomorrow  Recommendations Continue vanco and cefepime Stop INH for now - will need to review old MAI cx and sensitivities - would be best treated with azithro, rifampin and ethambutol but will likley have lots of drug interactions with his meds - amio, dig, etc Await bronch - would send AFB, fungal and routine cx Would probably suggest treatment acutely for the bacterial process and then restart MAI treatment once more stable   Thank you very much for allowing me to participate in the care of this patient. Please call with questions.   Cheral Marker. Ola Spurr, MD

## 2016-05-22 ENCOUNTER — Inpatient Hospital Stay: Payer: Medicare PPO

## 2016-05-22 ENCOUNTER — Encounter: Admission: EM | Disposition: E | Payer: Self-pay | Source: Home / Self Care | Attending: Internal Medicine

## 2016-05-22 DIAGNOSIS — E43 Unspecified severe protein-calorie malnutrition: Secondary | ICD-10-CM | POA: Insufficient documentation

## 2016-05-22 DIAGNOSIS — J9601 Acute respiratory failure with hypoxia: Secondary | ICD-10-CM | POA: Diagnosis present

## 2016-05-22 DIAGNOSIS — J9801 Acute bronchospasm: Secondary | ICD-10-CM

## 2016-05-22 DIAGNOSIS — J441 Chronic obstructive pulmonary disease with (acute) exacerbation: Secondary | ICD-10-CM

## 2016-05-22 HISTORY — PX: FLEXIBLE BRONCHOSCOPY: SHX5094

## 2016-05-22 LAB — BLOOD GAS, ARTERIAL
Acid-base deficit: 3.2 mmol/L — ABNORMAL HIGH (ref 0.0–2.0)
Bicarbonate: 25 mmol/L (ref 20.0–28.0)
FIO2: 0.6
O2 SAT: 88.9 %
PCO2 ART: 64 mmHg — AB (ref 32.0–48.0)
PEEP: 5 cmH2O
Patient temperature: 37
RATE: 15 resp/min
VT: 550 mL
pH, Arterial: 7.2 — ABNORMAL LOW (ref 7.350–7.450)
pO2, Arterial: 69 mmHg — ABNORMAL LOW (ref 83.0–108.0)

## 2016-05-22 LAB — COMPREHENSIVE METABOLIC PANEL
ALK PHOS: 57 U/L (ref 38–126)
ALT: 14 U/L — AB (ref 17–63)
ANION GAP: 6 (ref 5–15)
AST: 17 U/L (ref 15–41)
Albumin: 2.5 g/dL — ABNORMAL LOW (ref 3.5–5.0)
BILIRUBIN TOTAL: 0.6 mg/dL (ref 0.3–1.2)
BUN: 13 mg/dL (ref 6–20)
CALCIUM: 8.2 mg/dL — AB (ref 8.9–10.3)
CO2: 27 mmol/L (ref 22–32)
CREATININE: 0.68 mg/dL (ref 0.61–1.24)
Chloride: 102 mmol/L (ref 101–111)
Glucose, Bld: 171 mg/dL — ABNORMAL HIGH (ref 65–99)
Potassium: 3.9 mmol/L (ref 3.5–5.1)
Sodium: 135 mmol/L (ref 135–145)
TOTAL PROTEIN: 6.9 g/dL (ref 6.5–8.1)

## 2016-05-22 LAB — URINE CULTURE: Culture: NO GROWTH

## 2016-05-22 LAB — MAGNESIUM
MAGNESIUM: 1.9 mg/dL (ref 1.7–2.4)
MAGNESIUM: 1.9 mg/dL (ref 1.7–2.4)
Magnesium: 2 mg/dL (ref 1.7–2.4)

## 2016-05-22 LAB — CBC
HCT: 27.5 % — ABNORMAL LOW (ref 40.0–52.0)
HEMATOCRIT: 23.5 % — AB (ref 40.0–52.0)
HEMOGLOBIN: 7.6 g/dL — AB (ref 13.0–18.0)
HEMOGLOBIN: 8.6 g/dL — AB (ref 13.0–18.0)
MCH: 26.8 pg (ref 26.0–34.0)
MCH: 26.6 pg (ref 26.0–34.0)
MCHC: 32.2 g/dL (ref 32.0–36.0)
MCHC: 31.4 g/dL — AB (ref 32.0–36.0)
MCV: 83.3 fL (ref 80.0–100.0)
MCV: 84.7 fL (ref 80.0–100.0)
Platelets: 465 10*3/uL — ABNORMAL HIGH (ref 150–440)
Platelets: 640 10*3/uL — ABNORMAL HIGH (ref 150–440)
RBC: 2.82 MIL/uL — AB (ref 4.40–5.90)
RBC: 3.24 MIL/uL — AB (ref 4.40–5.90)
RDW: 15.4 % — ABNORMAL HIGH (ref 11.5–14.5)
RDW: 16.1 % — ABNORMAL HIGH (ref 11.5–14.5)
WBC: 16.8 10*3/uL — ABNORMAL HIGH (ref 3.8–10.6)
WBC: 45.4 10*3/uL — ABNORMAL HIGH (ref 3.8–10.6)

## 2016-05-22 LAB — PHOSPHORUS
PHOSPHORUS: 4.7 mg/dL — AB (ref 2.5–4.6)
PHOSPHORUS: 4 mg/dL (ref 2.5–4.6)
Phosphorus: 4.4 mg/dL (ref 2.5–4.6)

## 2016-05-22 LAB — GLUCOSE, CAPILLARY
GLUCOSE-CAPILLARY: 141 mg/dL — AB (ref 65–99)
Glucose-Capillary: 190 mg/dL — ABNORMAL HIGH (ref 65–99)

## 2016-05-22 SURGERY — BRONCHOSCOPY, FLEXIBLE
Anesthesia: Moderate Sedation | Laterality: Bilateral

## 2016-05-22 MED ORDER — FENTANYL CITRATE (PF) 100 MCG/2ML IJ SOLN
INTRAMUSCULAR | Status: AC
  Filled 2016-05-22: qty 4

## 2016-05-22 MED ORDER — VANCOMYCIN HCL IN DEXTROSE 1-5 GM/200ML-% IV SOLN
1000.0000 mg | Freq: Once | INTRAVENOUS | Status: AC
  Administered 2016-05-22: 1000 mg via INTRAVENOUS
  Filled 2016-05-22: qty 200

## 2016-05-22 MED ORDER — SODIUM CHLORIDE 0.9% FLUSH
3.0000 mL | Freq: Two times a day (BID) | INTRAVENOUS | Status: DC
  Administered 2016-05-23 – 2016-05-24 (×3): 3 mL via INTRAVENOUS

## 2016-05-22 MED ORDER — FENTANYL BOLUS VIA INFUSION
25.0000 ug | INTRAVENOUS | Status: DC | PRN
  Administered 2016-05-24: 25 ug via INTRAVENOUS
  Filled 2016-05-22: qty 25

## 2016-05-22 MED ORDER — SODIUM CHLORIDE 0.9 % IV SOLN: 250.0000 mL | INTRAVENOUS | Status: DC | PRN

## 2016-05-22 MED ORDER — ACETAMINOPHEN 325 MG PO TABS: 650.0000 mg | ORAL_TABLET | ORAL | Status: DC | PRN

## 2016-05-22 MED ORDER — FENTANYL CITRATE (PF) 100 MCG/2ML IJ SOLN
100.0000 ug | Freq: Once | INTRAMUSCULAR | Status: AC
  Administered 2016-05-22: 100 ug via INTRAVENOUS

## 2016-05-22 MED ORDER — METHYLPREDNISOLONE SODIUM SUCC 125 MG IJ SOLR
60.0000 mg | Freq: Four times a day (QID) | INTRAMUSCULAR | Status: DC
  Administered 2016-05-22 – 2016-05-24 (×8): 60 mg via INTRAVENOUS
  Filled 2016-05-22 (×8): qty 2

## 2016-05-22 MED ORDER — MIDAZOLAM HCL 2 MG/2ML IJ SOLN
1.0000 mg | INTRAMUSCULAR | Status: DC | PRN
  Administered 2016-05-22 – 2016-05-24 (×8): 1 mg via INTRAVENOUS
  Filled 2016-05-22 (×7): qty 2

## 2016-05-22 MED ORDER — VORICONAZOLE 200 MG IV SOLR
6.0000 mg/kg | Freq: Two times a day (BID) | INTRAVENOUS | Status: AC
  Administered 2016-05-22 – 2016-05-23 (×2): 310 mg via INTRAVENOUS
  Filled 2016-05-22 (×2): qty 310

## 2016-05-22 MED ORDER — MOMETASONE FURO-FORMOTEROL FUM 200-5 MCG/ACT IN AERO
2.0000 | INHALATION_SPRAY | Freq: Two times a day (BID) | RESPIRATORY_TRACT | Status: DC
Start: 2016-05-22 — End: 2016-05-23
  Filled 2016-05-22: qty 8.8

## 2016-05-22 MED ORDER — FENTANYL 2500MCG IN NS 250ML (10MCG/ML) PREMIX INFUSION
25.0000 ug/h | INTRAVENOUS | Status: DC
  Administered 2016-05-22: 200 ug/h via INTRAVENOUS
  Administered 2016-05-23 – 2016-05-24 (×3): 250 ug/h via INTRAVENOUS
  Filled 2016-05-22 (×5): qty 250

## 2016-05-22 MED ORDER — MIDAZOLAM HCL 5 MG/5ML IJ SOLN
INTRAMUSCULAR | Status: AC | PRN
  Administered 2016-05-22: 2 mg via INTRAVENOUS

## 2016-05-22 MED ORDER — VITAL HIGH PROTEIN PO LIQD
1000.0000 mL | ORAL | Status: DC
  Administered 2016-05-22: 18:00:00
  Administered 2016-05-22: 1000 mL

## 2016-05-22 MED ORDER — NOREPINEPHRINE BITARTRATE 1 MG/ML IV SOLN: 0.0000 ug/min | INTRAVENOUS | Status: DC

## 2016-05-22 MED ORDER — MIDAZOLAM HCL 2 MG/2ML IJ SOLN
1.0000 mg | INTRAMUSCULAR | Status: AC | PRN
  Administered 2016-05-23 (×3): 1 mg via INTRAVENOUS
  Filled 2016-05-22 (×4): qty 2

## 2016-05-22 MED ORDER — MIDAZOLAM HCL 2 MG/2ML IJ SOLN
2.0000 mg | Freq: Once | INTRAMUSCULAR | Status: AC
  Administered 2016-05-22: 2 mg via INTRAVENOUS

## 2016-05-22 MED ORDER — VORICONAZOLE 200 MG IV SOLR
4.0000 mg/kg | Freq: Two times a day (BID) | INTRAVENOUS | Status: DC
  Administered 2016-05-23 – 2016-05-24 (×2): 210 mg via INTRAVENOUS
  Filled 2016-05-22 (×4): qty 210

## 2016-05-22 MED ORDER — SODIUM CHLORIDE 0.9 % IV SOLN
250.0000 mL | INTRAVENOUS | Status: DC | PRN
  Administered 2016-05-22: 250 mL via INTRAVENOUS

## 2016-05-22 MED ORDER — MIDAZOLAM HCL 5 MG/5ML IJ SOLN
INTRAMUSCULAR | Status: AC
  Filled 2016-05-22: qty 10

## 2016-05-22 MED ORDER — IPRATROPIUM-ALBUTEROL 0.5-2.5 (3) MG/3ML IN SOLN
3.0000 mL | RESPIRATORY_TRACT | Status: AC
  Administered 2016-05-22 – 2016-05-23 (×5): 3 mL via RESPIRATORY_TRACT
  Filled 2016-05-22 (×7): qty 3

## 2016-05-22 MED ORDER — STERILE WATER FOR INJECTION IJ SOLN
INTRAMUSCULAR | Status: AC
  Administered 2016-05-22: 10 mL
  Filled 2016-05-22: qty 10

## 2016-05-22 MED ORDER — PRO-STAT SUGAR FREE PO LIQD
30.0000 mL | Freq: Two times a day (BID) | ORAL | Status: DC
  Administered 2016-05-22 – 2016-05-23 (×3): 30 mL

## 2016-05-22 MED ORDER — CHLORHEXIDINE GLUCONATE 0.12% ORAL RINSE (MEDLINE KIT)
15.0000 mL | Freq: Two times a day (BID) | OROMUCOSAL | Status: DC
  Administered 2016-05-22 – 2016-05-24 (×4): 15 mL via OROMUCOSAL

## 2016-05-22 MED ORDER — PIPERACILLIN-TAZOBACTAM 3.375 G IVPB
3.3750 g | Freq: Three times a day (TID) | INTRAVENOUS | Status: DC
  Administered 2016-05-22 – 2016-05-24 (×7): 3.375 g via INTRAVENOUS
  Filled 2016-05-22 (×7): qty 50

## 2016-05-22 MED ORDER — VECURONIUM BROMIDE 10 MG IV SOLR
10.0000 mg | Freq: Once | INTRAVENOUS | Status: AC
  Administered 2016-05-22: 10 mg via INTRAVENOUS

## 2016-05-22 MED ORDER — ENOXAPARIN SODIUM 40 MG/0.4ML ~~LOC~~ SOLN
40.0000 mg | SUBCUTANEOUS | Status: DC
  Administered 2016-05-22 – 2016-05-23 (×2): 40 mg via SUBCUTANEOUS
  Filled 2016-05-22 (×2): qty 0.4

## 2016-05-22 MED ORDER — VANCOMYCIN HCL IN DEXTROSE 750-5 MG/150ML-% IV SOLN
750.0000 mg | Freq: Two times a day (BID) | INTRAVENOUS | Status: DC
  Administered 2016-05-23 – 2016-05-24 (×3): 750 mg via INTRAVENOUS
  Filled 2016-05-22 (×4): qty 150

## 2016-05-22 MED ORDER — MIDAZOLAM HCL 2 MG/2ML IJ SOLN
INTRAMUSCULAR | Status: AC
  Administered 2016-05-22: 2 mg via INTRAVENOUS
  Filled 2016-05-22: qty 4

## 2016-05-22 MED ORDER — SODIUM CHLORIDE 0.9 % IV SOLN
INTRAVENOUS | Status: DC
Start: 2016-05-22 — End: 2016-05-23
  Administered 2016-05-22: 21:00:00 via INTRAVENOUS

## 2016-05-22 MED ORDER — FENTANYL CITRATE (PF) 100 MCG/2ML IJ SOLN
INTRAMUSCULAR | Status: AC | PRN
  Administered 2016-05-22: 50 ug via INTRAVENOUS

## 2016-05-22 MED ORDER — FENTANYL CITRATE (PF) 100 MCG/2ML IJ SOLN
INTRAMUSCULAR | Status: AC
  Administered 2016-05-22: 100 ug via INTRAVENOUS
  Filled 2016-05-22: qty 4

## 2016-05-22 MED ORDER — NOREPINEPHRINE 4 MG/250ML-% IV SOLN
INTRAVENOUS | Status: AC
  Administered 2016-05-22: 4 mg
  Filled 2016-05-22: qty 250

## 2016-05-22 MED ORDER — VECURONIUM BROMIDE 10 MG IV SOLR
INTRAVENOUS | Status: AC
  Administered 2016-05-22: 10 mg via INTRAVENOUS
  Filled 2016-05-22: qty 10

## 2016-05-22 MED ORDER — NOREPINEPHRINE 4 MG/250ML-% IV SOLN
0.0000 ug/min | INTRAVENOUS | Status: DC
  Administered 2016-05-22 – 2016-05-23 (×2): 4 ug/min via INTRAVENOUS
  Administered 2016-05-24: 2 ug/min via INTRAVENOUS
  Administered 2016-05-24: 3 ug/min via INTRAVENOUS
  Filled 2016-05-22 (×2): qty 250

## 2016-05-22 MED ORDER — ORAL CARE MOUTH RINSE
15.0000 mL | Freq: Four times a day (QID) | OROMUCOSAL | Status: DC
  Administered 2016-05-22 – 2016-05-24 (×8): 15 mL via OROMUCOSAL

## 2016-05-22 MED ORDER — SODIUM CHLORIDE 0.9% FLUSH: 3.0000 mL | INTRAVENOUS | Status: DC | PRN

## 2016-05-22 MED ORDER — FREE WATER
30.0000 mL | Freq: Three times a day (TID) | Status: DC
  Administered 2016-05-22 – 2016-05-24 (×6): 30 mL

## 2016-05-22 MED ORDER — FENTANYL CITRATE (PF) 100 MCG/2ML IJ SOLN: 50.0000 ug | Freq: Once | INTRAMUSCULAR | Status: DC

## 2016-05-22 NOTE — Op Note (Signed)
[facility] Patient Name: Jeremy Fields Procedure Date: 05/19/2016 12:05 PM MRN: [id number] Account #: [id number] Date of Birth: September 22, 1945 Admit Type: Inpatient Age: 70 Room: [facility] PROCEDURE RM 01 Gender: Male Note Status: Finalized Attending MD: Yevonne Pax , MD Procedure:         Bronchoscopy Indications:       Left upper lobe bacterial pneumonia, Right upper lobe                     bacterial pneumonia Providers:         Yevonne Pax, MD Referring MD:       Medicines:         Fentanyl 50 mcg IV, Midazolam 2 mg IV Complications:     No immediate complications Procedure:         Pre-Anesthesia Assessment:                    - A History and Physical has been performed. The patient's                     medications, allergies and sensitivities have been                     reviewed.                    - The risks and benefits of the procedure and the sedation                     options and risks were discussed with the patient. All                     questions were answered and informed consent was obtained.                    - Pre-procedure physical examination revealed no                     contraindications to sedation.                    - ASA Grade Assessment: III - A patient with severe                     systemic disease.                    - After reviewing the risks and benefits, the patient was                     deemed in satisfactory condition to undergo the procedure.                    - The anesthesia plan was to use moderate                     sedation/analgesia.                    - Immediately prior to administration of medications, the                     patient was re-assessed for adequacy to receive sedatives.                    - The heart rate, respiratory rate, oxygen saturations,  blood pressure, adequacy of pulmonary ventilation, and                     response to care were monitored throughout the  procedure.                    - The physical status of the patient was re-assessed after                     the procedure.                    After obtaining informed consent, the bronchoscope was                     passed under direct vision. Throughout the procedure, the                     patient's blood pressure, pulse, and oxygen saturations                     were monitored continuously. the Bronchoscope Olympus                     [id number] was introduced through the mouth and                     advanced to the tracheobronchial tree of both lungs. The                     procedure was accomplished without difficulty. The patient                     tolerated the procedure well. The total duration of the                     procedure was 15 minutes. Findings:      Bilateral Lung Abnormalities: Copious, mucoid, white, thin secretions       were found throughout the tracheobronchial tree. They were not       obstructing the airway. Washings were obtained in the apical segment of       the right upper lobe, in the posterior segment of the right upper lobe       and in the anterior segment of the right upper lobe and sent for cell       count, bacterial culture, viral smears & culture, and fungal & AFB       analysis, bacterial, AFB and fungal analysis, bacterial, AFB, fungal and       viral analysis, aerobic culture, anaerobic culture, AFB analysis &       culture, Chlamydia, Legionella (DFA & culture), Mycoplasma and Nocardia.       The return was cellular and cloudy. Multiple specimens were obtained and       pooled into one specimen, which was sent for analysis. Impression:        - Copious, mucoid, white, thin secretions were found                     throughout the tracheobronchial tree.                    - Washings were obtained. Recommendation:    - Await culture and washing results. Freda Munro, MD Yevonne Pax, MD 05/27/2016 12:51:15 PM This report has  been signed  electronically. Number of Addenda: 0 Note Initiated On: 05/18/2016 12:05 PM      [facility]

## 2016-05-22 NOTE — Progress Notes (Signed)
Patient on 2L nasal cannula pre-procedure. Tolerated bronchoscopy well with mild coughing, vital signs remained stable throughout procedure, received total of 50mcg fentanyl and 2mg  versed during procedure. Post-procedure, patient began to have increased work of breathing, oxygen saturation dropped into 50's, Dr Welton FlakesKhan and respiratory therapist present, patient was placed on non-rebreather, breathing treatment administered with improvement in oxygen saturation, although patient continued to have increased work of breathing. Patient was transferred to stepdown/ICU, MD present for intubation, report given to ICU nurse once patient was stabilized.

## 2016-05-22 NOTE — Progress Notes (Signed)
Called elink re hypothermia, bair hugger applied

## 2016-05-22 NOTE — Progress Notes (Signed)
eLink Physician-Brief Progress Note Patient Name: Jeremy Fields DOB: 06/07/1946 MRN: [id number]   Date of Service  September 25, 2015  HPI/Events of Note  hypothermia  eICU Interventions  Bear hugger     Intervention Category Minor Interventions: Routine modifications to care plan (e.g. PRN medications for pain, fever)  Max FickleDouglas McQuaid September 25, 2015, 6:53 PM

## 2016-05-22 NOTE — Progress Notes (Signed)
PT Cancellation Note  Patient Details Name: Jeremy Fields MRN: [id number] DOB: [birth year]   Cancelled Treatment:    Reason Eval/Treat Not Completed: Other (comment).  Pt decompensated s/p bronchoscopy today and transferred to ICU and intubated d/t acute respiratory failure.  D/t significant change in status and transfer to higher level of care, will discontinue current PT order.  Please re-consult PT when pt is medically appropriate to participate in PT.   Irving Burtonmily Sanuel Ladnier 05/13/2016, 4:44 PM Hendricks LimesEmily Aiyanna Awtrey, PT [phone]

## 2016-05-22 NOTE — OR Nursing (Signed)
Dr Welton FlakesKhan aware pt on Xeralto, hold am dose,

## 2016-05-22 NOTE — Progress Notes (Signed)
 Brief Nutrition Note  Consult received for enteral/tube feeding initiation and management.  Adult Enteral Nutrition Protocol initiated. Full assessment to follow.   Admitting Dx: Leukocytosis [D72.829] Dyspnea [R06.00] Prolonged Q-T interval on ECG [I45.81] Acute respiratory failure with hypoxia (HCC) [J96.01] HCAP (healthcare-associated pneumonia) [J18.9]  Body mass index is 18.46 kg/m.   Labs:   Recent Labs Lab 06/08/2016 1328 05/27/2016 1816 05/21/16 0439  NA 135  --  135  K 4.2  --  4.0  CL 99*  --  105  CO2 29  --  25  BUN 20  --  17  CREATININE 0.78 0.79 0.64  CALCIUM 8.6*  --  7.6*  MG 1.8 2.4  --   PHOS  --  3.4  --   GLUCOSE 112*  --  95     Camille Dragan MS, RD, LDN [phone] Pager  [phone] Weekend/On-Call Pager

## 2016-05-22 NOTE — Progress Notes (Signed)
Pharmacy Antibiotic Note  Jeremy Fields is a 70 y.o. male admitted on 06/06/2016 with pneumonia and possible fungal infection. Pharmacy has been consulted for piperacillin/tazobactam, vanc and voriconazole dosing.  Plan: Currently receiving: Piperacillin/tazo 3.375 g IE q 8 hours Vanc 750 mg IV q 12 h with goal trough 15-20. Dosing CrCl 64.13 and wt 51.3 Ke 0.063; Vd 36; t1/2: 11.95. Trough at Css is estimated to be 20.2. Will order trough prior to 4th dose. Voriconazole 310 mg IV q 12 x 2 doses followed by 210 mg IV q 12 h.  Height: 5\' 10"  (177.8 cm) Weight: 113 lb 1.5 oz (51.3 kg) IBW/kg (Calculated) : 73  Temp (24hrs), Avg:98.3 F (36.8 C), Min:97.3 F (36.3 C), Max:98.9 F (37.2 C)   Recent Labs Lab 05/14/2016 1328 06/08/2016 1425 06/08/2016 1816 05/21/16 0439 2016-02-22 0339 2016-02-22 1400  WBC 19.6*  --  19.1* 15.8* 16.8* 45.4*  CREATININE 0.78  --  0.79 0.64  --  0.68  LATICACIDVEN  --  0.8 1.9  --   --   --     Estimated Creatinine Clearance: 64.1 mL/min (by C-G formula based on SCr of 0.68 mg/dL).    No Known Allergies  Antimicrobials this admission: Cefepime 9/11 >> 9/13 Isoniazid 9/11 >> 9/12 Zosyn 9/13 >> Vanc 9/11 >>  Dose adjustments this admission: Patient's vanc was discontinued on 9/12 and reinitiated 9/13.  Microbiology results: 9/11 BCx: Pending 9/11 UCx: No growth  9/12 MRSA PCR: Negative  Thank you for allowing pharmacy to be a part of this patient's care.  Horris LatinoHolly Gilliam, PharmD Pharmacy Resident 2016-02-02 4:32 PM

## 2016-05-22 NOTE — Care Management (Signed)
Bronch today. Would benefit from palliative consult.

## 2016-05-22 NOTE — Procedures (Signed)
Central Venous Catheter Insertion Procedure Note- Right Internal Jugular Jeremy Fields [id number] 05/30/1946  Procedure: Insertion of Central Venous Catheter Indications: Assessment of intravascular volume, Drug and/or fluid administration and Frequent blood sampling  Procedure Details Consent: Risks of procedure as well as the alternatives and risks of each were explained to the (patient/caregiver).  Consent for procedure obtained. Time Out: Verified patient identification, verified procedure, site/side was marked, verified correct patient position, special equipment/implants available, medications/allergies/relevent history reviewed, required imaging and test results available.  Performed  Maximum sterile technique was used including antiseptics, cap, gloves, gown, hand hygiene, mask and sheet. Skin prep: Chlorhexidine; local anesthetic administered A antimicrobial bonded/coated triple lumen catheter was placed in the right internal jugular vein using the Seldinger technique.  Evaluation Blood flow good Complications: No apparent complications Patient did tolerate procedure well. Chest X-ray ordered to verify placement.  CXR: pending.  Procedure performed under direct ultrasound guidance for real time vessel cannulation.        Bincy Varughese,AG-ACNP Pulmonary & Critical Care  I was immediately available for procedure   Lucie LeatherKurian David Epimenio Schetter, M.D.  [facility]  Medical Director [facility] Medical Director [facility] Card[facility]

## 2016-05-22 NOTE — Progress Notes (Signed)
Patient underwent bronchoscopy without any issues. Post procedure patient noted to drop his saturations so he was placed on 100% fio2. He did recover his sats to 95-96% on 100% FiO2. He had notable wheeze so was given a duoneb treatment. In addition CXR ordered and also we will place him on IV solumedrol and transfer to stepdown unit overnight.  I called and spoke with Dr Belia HemanKasa in the ICU as accepting physician.

## 2016-05-22 NOTE — Progress Notes (Signed)
eLink Physician-Brief Progress Note Patient Name: Jeremy SantiagoDennis M Maheu DOB: 01-20-1946 MRN: [id number]   Date of Service  05/24/2016  HPI/Events of Note  Discussed anticoagulation need with pharmacy Has Afib Intubated for respiratory failure today post procedure  eICU Interventions  Hold Xarelto today Use prophylactic lovenox Rounding team to address need for full or prophylactic anticoagulation on 9/14     Intervention Category Intermediate Interventions: Best-practice therapies (e.g. DVT, beta blocker, etc.)  Max FickleDouglas Phoenyx Paulsen 05/28/2016, 4:31 PM

## 2016-05-22 NOTE — Progress Notes (Signed)
[employer] - [place] [facility]   PATIENT NAME: Jeremy Fields    MR#:  [id number]  DATE OF BIRTH:  19-Jul-1946  SUBJECTIVE:   Patient here due to shortness of breath and noted to have cavitary pneumonia. Patient CT scan of the chest showing increasing interval change in the left upper lobe cavitary area. Patient has a previous history of MAI. Still feeling short of breath which is chronic for the patient. Going for Bronch later today.   REVIEW OF SYSTEMS:    Review of Systems  Constitutional: Negative for chills and fever.  HENT: Negative for congestion and tinnitus.   Eyes: Negative for blurred vision and double vision.  Respiratory: Positive for cough and shortness of breath. Negative for wheezing.   Cardiovascular: Negative for chest pain, orthopnea and PND.  Gastrointestinal: Negative for abdominal pain, diarrhea, nausea and vomiting.  Genitourinary: Negative for dysuria and hematuria.  Neurological: Negative for dizziness, sensory change and focal weakness.  All other systems reviewed and are negative.   Nutrition: Heart Healthy Tolerating Diet: Yes Tolerating PT: Await eval.    DRUG ALLERGIES:  No Known Allergies  VITALS:  Blood pressure (!) 157/91, pulse (!) 114, temperature 98.2 F (36.8 C), temperature source Oral, resp. rate (!) 26, height 5\' 9"  (1.753 m), weight 56.7 kg (125 lb), SpO2 96 %.  PHYSICAL EXAMINATION:   Physical Exam  GENERAL:  70 y.o.-year-old patient lying in bed in no acute distress.  EYES: Pupils equal, round, reactive to light and accommodation. No scleral icterus. Extraocular muscles intact.  HEENT: Head atraumatic, normocephalic. Oropharynx and nasopharynx clear.  NECK:  Supple, no jugular venous distention. No thyroid enlargement, no tenderness.  LUNGS: Prolonged insp. & exp. Phase, no wheezing, rhonchi, rales. No use of accessory muscles of respiration.  CARDIOVASCULAR: S1, S2 normal. No murmurs, rubs, or gallops.   ABDOMEN: Soft, nontender, nondistended. Bowel sounds present. No organomegaly or mass.  EXTREMITIES: No cyanosis, clubbing or edema b/l.    NEUROLOGIC: Cranial nerves II through XII are intact. No focal Motor or sensory deficits b/l.   PSYCHIATRIC: The patient is alert and oriented x 3.  SKIN: No obvious rash, lesion, or ulcer.    LABORATORY PANEL:   CBC  Recent Labs Lab 11-Jun-2016 0339  WBC 16.8*  HGB 7.6*  HCT 23.5*  PLT 465*   ------------------------------------------------------------------------------------------------------------------  Chemistries   Recent Labs Lab 05/11/2016 1328 06/06/2016 1816 05/21/16 0439  NA 135  --  135  K 4.2  --  4.0  CL 99*  --  105  CO2 29  --  25  GLUCOSE 112*  --  95  BUN 20  --  17  CREATININE 0.78 0.79 0.64  CALCIUM 8.6*  --  7.6*  MG 1.8 2.4  --   AST 22  --   --   ALT 17  --   --   ALKPHOS 54  --   --   BILITOT <0.1*  --   --    ------------------------------------------------------------------------------------------------------------------  Cardiac Enzymes  Recent Labs Lab 05/29/2016 1328  TROPONINI <0.03   ------------------------------------------------------------------------------------------------------------------  RADIOLOGY:  Dg Chest 2 View  Result Date: 05/11/2016 CLINICAL DATA:  Severe shortness of breath.  Known COPD EXAM: CHEST  2 VIEW COMPARISON:  Chest x-ray of April 01, 2016 and January 03, 2016. FINDINGS: The lungs are hyperinflated. There are confluent alveolar opacities in both upper lobes. On the right the findings are new and are peripherally located. On the left the findings  are not entirely new but are more conspicuous than on the previous exam. The lower lobes and the middle lobe appears spared. The heart and pulmonary vascularity are normal. There is calcification in the wall of the aortic arch. The mediastinum is normal in width. The bony thorax exhibits no acute abnormality. IMPRESSION: Confluent  alveolar opacities in both upper lobes worrisome for pneumonia which may be due to typical bacterial etiologies or due to tuberculosis or M. avium intracellulaire. There has been considerable interval deterioration in the appearance of the chest since the July 24th and January 03, 2016 studies. Electronically Signed   By: David  SwazilandJordan M.D.   On: May 14, 2016 14:10   Ct Chest W Contrast  Result Date: 12-22-15 CLINICAL DATA:  Acute hypoxic respiratory failure, upper airway consolidation question pneumonia or mass, quit smoking last week, history COPD, hypertension, atrial fibrillation, CHF EXAM: CT CHEST WITH CONTRAST TECHNIQUE: Multidetector CT imaging of the chest was performed during intravenous contrast administration. Sagittal and coronal MPR images reconstructed from axial data set. Fell CONTRAST:  75mL ISOVUE-300 IOPAMIDOL (ISOVUE-300) INJECTION 61% IV COMPARISON:  06/08/2015 FINDINGS: Cardiovascular: Atherosclerotic calcifications of the aorta and coronary arteries as well as proximal great vessels. 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 Pulmonary arteries grossly patent on nondedicated exam. No pericardial effusion. Significant narrowing of celiac, SMA, and LEFT renal artery origins. Mediastinum/Nodes: Esophagus unremarkable. AP window adenopathy increased, 11 mm short axis node image 51 and 10 mm short axis node image 54. 10 mm short axis precarinal node image 60. 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 Chronic nodularity in the RIGHT middle lobe and lingula again seen. New large area of consolidation in the lateral to posterior RIGHT upper lobe, question undergoing early cavitation. Marked interval increase in size of a cavitary region in the LEFT upper lobe, demonstrating a significantly thickened and irregular wall versus the previous study. Areas of lower attenuation within the LEFT upper lobe may represent necrotic tissue or  mycetoma. Multiple new nodular foci in both lungs vertically RIGHT lower lobe up to 9 mm diameter image 131. Increasing peripheral opacity in the lingula as well extending to pleural surface. Chronic nodularity in medial RIGHT lower lobe. Chronic cavitary opacity RIGHT upper lobe 5.3 x 1.6 x 1.8 cm. Upper Abdomen: Question enlarged periportal lymph node 13 mm short axis on last image 170. Remaining visualized upper abdomen unremarkable. Musculoskeletal: No acute osseous lesions. IMPRESSION: Emphysematous changes with prior BILATERAL upper lobe scarring and cavitary disease with chronic pulmonary nodularity involving both upper lobes and medial RIGHT lower lobe. Significant interval change with marked interval enlargement of a cavitary region in the LEFT upper lobe demonstrating a thick irregular wall and question either necrotic tissue or mycetoma. Additional new area of consolidation with suspect developing cavitation and necrosis in the lateral RIGHT upper lobe as well. Increased nodularity particularly in LEFT lower lobe. In light of the underlying chronic findings, the interval changes may represent progression of atypical infection with areas of lung necrosis and cavitation though with the thickened irregular wall particularly in the LEFT upper lobe cavitary focus cannot completely exclude tumor. Recommend close pulmonology follow-up to exclude tumor as etiology. Electronically Signed   By: Ulyses SouthwardMark  Boles M.D.   On: May 14, 2016 16:21     ASSESSMENT AND PLAN:   70 year old male with past medical history of tonic respiratory failure, COPD, chronic atrial dilation, CHF, history of previous MAI pneumonia who presented to the hospital due to shortness of  breath.  1. Acute on chronic respiratory failure with hypoxia-secondary to underlying COPD combined with cavitary pneumonia. -Continue O2 supplementation, Continue IV cefepime, cultures so far negative. -Going for bronchoscopy today. -cont. Duonebs, Pulmicort  nebs.   2. Pneumonia - pt. CT chest showing worsening of Left upper lobe cavitary lesion along with possible pneumonia on right upper lobe as well.  - cont. IV Cefepime and follow cultures.  - pt. Has hx of MAI and has not been compliant with meds as per Pulmonary.  - appreciate ID and pulm. Input.  S/p bronch today and will follow up bronchial washings for AFB, Fungus, culture.     - cont. Current supportive care.   3. Hx of Atrial fibrillation - rate controlled.  Cont. Coreg, Digoxin.  - cont. Xarelto.   4. Anxiety - cont. Xanax.   5. Hyperlipidemia - cont. Atorvastatin.    All the records are reviewed and case discussed with Care Management/Social Worker. Management plans discussed with the patient, family and they are in agreement.  CODE STATUS: Full   DVT Prophylaxis: Xarelto  TOTAL TIME TAKING CARE OF THIS PATIENT: 30 minutes.   POSSIBLE D/C IN 2-3 DAYS, DEPENDING ON CLINICAL CONDITION.   Houston Siren M.D on 05-27-2016 at 1:11 PM  Between 7am to 6pm - Pager - [phone]  After 6pm go to [email or web address] - Scientist, research (life sciences) [facility]  Office  [phone]  CC: Primary care physician; Lyndon Code, MD

## 2016-05-22 NOTE — Progress Notes (Signed)
[facility] INFECTIOUS DISEASE PROGRESS NOTE Date of Admission:  05/19/2016     ID: Jeremy Fields is a 70 y.o. male with PNA, prior MAI Active Problems:   Sepsis due to pneumonia (HCC)   Protein-calorie malnutrition, severe   Acute respiratory failure with hypoxia (HCC)   Bronchospasm   Subjective: Decompensated after bronch today, now intubated. WBC elevated. No fevers  ROS  Unable to obtain   Medications:  Antibiotics Given (last 72 hours)    Date/Time Action Medication Dose Rate   05/14/2016 1607 Given   isoniazid (NYDRAZID) tablet 300 mg 300 mg    05/16/2016 2232 Given   ceFEPIme (MAXIPIME) 2 g in dextrose 5 % 50 mL IVPB 2 g 100 mL/hr   06/02/2016 2236 Given   vancomycin (VANCOCIN) IVPB 750 mg/150 ml premix 750 mg 150 mL/hr   05/21/16 0517 Given   ceFEPIme (MAXIPIME) 2 g in dextrose 5 % 50 mL IVPB 2 g 100 mL/hr   05/21/16 0931 Given   isoniazid (NYDRAZID) tablet 300 mg 300 mg    05/21/16 0932 Given   vancomycin (VANCOCIN) IVPB 750 mg/150 ml premix 750 mg 150 mL/hr   05/21/16 1411 Given   ceFEPIme (MAXIPIME) 2 g in dextrose 5 % 50 mL IVPB 2 g 100 mL/hr   05/21/16 2126 Given   ceFEPIme (MAXIPIME) 2 g in dextrose 5 % 50 mL IVPB 2 g 100 mL/hr   05/24/16 0505 Given   ceFEPIme (MAXIPIME) 2 g in dextrose 5 % 50 mL IVPB 2 g 100 mL/hr     . atorvastatin  80 mg Oral Daily  . butamben-tetracaine-benzocaine  1 spray Topical Once  . carvedilol  3.125 mg Oral BID  . celecoxib  200 mg Oral BID  . dextromethorphan  30 mg Oral BID   And  . guaiFENesin  600 mg Oral BID  . digoxin  0.125 mg Oral Daily  . enoxaparin (LOVENOX) injection  40 mg Subcutaneous Q24H  . feeding supplement (ENSURE ENLIVE)  237 mL Oral TID BM  . feeding supplement (PRO-STAT SUGAR FREE 64)  30 mL Per Tube BID  . feeding supplement (VITAL HIGH PROTEIN)  1,000 mL Per Tube Q24H  . fentaNYL      . fentaNYL (SUBLIMAZE) injection  50 mcg Intravenous Once  . ipratropium-albuterol  3 mL Nebulization Q4H  .  lidocaine  1 application Topical Once  . methylPREDNISolone (SOLU-MEDROL) injection  60 mg Intravenous Q6H  . midazolam      . mometasone-formoterol  2 puff Inhalation BID  . piperacillin-tazobactam (ZOSYN)  IV  3.375 g Intravenous Q8H  . rivaroxaban  20 mg Oral Daily  . sodium chloride flush  3 mL Intravenous Q12H  . sodium chloride flush  3 mL Intravenous Q12H  . sterile water (preservative free)      . vancomycin  1,000 mg Intravenous Once  . vecuronium      . vecuronium  10 mg Intravenous Once    Objective: Vital signs in last 24 hours: Temp:  [97.3 F (36.3 C)-98.9 F (37.2 C)] (P) 97.8 F (36.6 C) (09/13 1400) Pulse Rate:  [81-117] 104 (09/13 1345) Resp:  [16-27] 17 (09/13 1345) BP: (89-172)/(50-112) 140/80 (09/13 1345) SpO2:  [55 %-100 %] 100 % (09/13 1345) FiO2 (%):  [40 %] (P) 40 % (09/13 1400) Physical Exam  Constitutional: intubated, chronically ill appearing HENT: anicteric Mouth/Throat: ETT in place Cardiovascular: Tachy Pulmonary/Chest: bronchial BS Abdominal: Soft. Bowel sounds are normal. He exhibits no distension. There  is no tenderness.  Lymphadenopathy: He has no cervical adenopathy.  Neurological: sedated Skin: Skin is warm and dry. No rash noted. No erythema.  Psychiatric: He has a normal mood and affect. His behavior is normal.  GU - foley in place   Lab Results  Recent Labs  05/21/16 0439 06/16/16 0339 06/16/2016 1400  WBC 15.8* 16.8* 45.4*  HGB 7.4* 7.6* 8.6*  HCT 22.4* 23.5* 27.5*  NA 135  --  135  K 4.0  --  3.9  CL 105  --  102  CO2 25  --  27  BUN 17  --  13  CREATININE 0.64  --  0.68    Microbiology: Results for orders placed or performed during the hospital encounter of 05/31/2016  Blood Culture (routine x 2)     Status: None (Preliminary result)   Collection Time: 05/16/2016  2:25 PM  Result Value Ref Range Status   Specimen Description BLOOD AFA  Final   Special Requests   Final    BOTTLES DRAWN AEROBIC AND ANAEROBIC  AERO 8CC  ANA6CC   Culture NO GROWTH 2 DAYS  Final   Report Status PENDING  Incomplete  Blood Culture (routine x 2)     Status: None (Preliminary result)   Collection Time: 05/18/2016  2:25 PM  Result Value Ref Range Status   Specimen Description BLOOD  L AC  Final   Special Requests BOTTLES DRAWN AEROBIC AND ANAEROBIC 8CC  Final   Culture NO GROWTH 2 DAYS  Final   Report Status PENDING  Incomplete  Urine culture     Status: None   Collection Time: 06/08/2016 11:47 PM  Result Value Ref Range Status   Specimen Description URINE, RANDOM  Final   Special Requests NONE  Final   Culture NO GROWTH Performed at [facility]   Final   Report Status 16-Jun-2016 FINAL  Final  MRSA PCR Screening     Status: None   Collection Time: 05/21/16 11:00 AM  Result Value Ref Range Status   MRSA by PCR NEGATIVE NEGATIVE Final    Comment:        The GeneXpert MRSA Assay (FDA approved for NASAL specimens only), is one component of a comprehensive MRSA colonization surveillance program. It is not intended to diagnose MRSA infection nor to guide or monitor treatment for MRSA infections.     Studies/Results: Dg Chest 1 View  Result Date: Jun 16, 2016 CLINICAL DATA:  Status post intubation EXAM: CHEST 1 VIEW COMPARISON:  Portable chest x-ray of earlier today. FINDINGS: The endotracheal tube tip lies approximately 6.2 cm above the carina at the level of the clavicular heads. The esophagogastric tube tip projects below the inferior margin of the image. There is stable increased density peripherally in the right upper lobe and patchy increased density in the left upper lobe. The heart and pulmonary vascularity are normal. There is calcification in the wall of the thoracic aorta. IMPRESSION: Interval intubation of the trachea and esophagus. Underlying COPD. Persistent alveolar opacities in the upper lobes bilaterally. Electronically Signed   By: Tametria Aho  [place] M.D.   On: Jun 16, 2016 14:11   Dg Chest 1 View  Result  Date: 06/16/16 CLINICAL DATA:  Bronchospasm.  Difficulty breathing. EXAM: CHEST 1 VIEW COMPARISON:  CT chest 05/24/2016 FINDINGS: The heart is enlarged. Emphysematous changes are noted. There is no edema or effusion to suggest failure. Atherosclerotic changes are noted at the aortic arch. Bilateral upper lobe airspace opacities are stable. IMPRESSION: Stable bilateral upper  lobe airspace opacities compatible with known cavitary lesions Cardiomegaly without failure. No significant interval change. Electronically Signed   By: Marin Robertshristopher  Mattern M.D.   On: 05/30/2016 13:28   Dg Abd 1 View  Result Date: 05/24/2016 CLINICAL DATA:  Orogastric tube placement. EXAM: ABDOMEN - 1 VIEW COMPARISON:  Chest radiograph same date.  Chest CT 06/08/2015. FINDINGS: Orogastric tube is looped in the proximal stomach. The visualized bowel gas pattern is normal. There is no evidence of free intraperitoneal air. Mid left abdominal calcification may reflect a renal calculus. Vascular calcifications and lumbar spondylosis are noted. IMPRESSION: Orogastric tube looped in the proximal stomach. Electronically Signed   By: Carey BullocksWilliam  Veazey M.D.   On: 06/01/2016 14:11   Ct Chest W Contrast  Result Date: 05/18/2016 CLINICAL DATA:  Acute hypoxic respiratory failure, upper airway consolidation question pneumonia or mass, quit smoking last week, history COPD, hypertension, atrial fibrillation, CHF EXAM: CT CHEST WITH CONTRAST TECHNIQUE: Multidetector CT imaging of the chest was performed during intravenous contrast administration. Sagittal and coronal MPR images reconstructed from axial data set. Fell CONTRAST:  75mL ISOVUE-300 IOPAMIDOL (ISOVUE-300) INJECTION 61% IV COMPARISON:  06/08/2015 FINDINGS: Cardiovascular: Atherosclerotic calcifications of the aorta and coronary arteries as well as proximal great vessels. 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 Pulmonary arteries grossly patent on  nondedicated exam. No pericardial effusion. Significant narrowing of celiac, SMA, and LEFT renal artery origins. Mediastinum/Nodes: Esophagus unremarkable. AP window adenopathy increased, 11 mm short axis node image 51 and 10 mm short axis node image 54. 10 mm short axis precarinal node image 60. 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 Chronic nodularity in the RIGHT middle lobe and lingula again seen. New large area of consolidation in the lateral to posterior RIGHT upper lobe, question undergoing early cavitation. Marked interval increase in size of a cavitary region in the LEFT upper lobe, demonstrating a significantly thickened and irregular wall versus the previous study. Areas of lower attenuation within the LEFT upper lobe may represent necrotic tissue or mycetoma. Multiple new nodular foci in both lungs vertically RIGHT lower lobe up to 9 mm diameter image 131. Increasing peripheral opacity in the lingula as well extending to pleural surface. Chronic nodularity in medial RIGHT lower lobe. Chronic cavitary opacity RIGHT upper lobe 5.3 x 1.6 x 1.8 cm. Upper Abdomen: Question enlarged periportal lymph node 13 mm short axis on last image 170. Remaining visualized upper abdomen unremarkable. Musculoskeletal: No acute osseous lesions. IMPRESSION: Emphysematous changes with prior BILATERAL upper lobe scarring and cavitary disease with chronic pulmonary nodularity involving both upper lobes and medial RIGHT lower lobe. Significant interval change with marked interval enlargement of a cavitary region in the LEFT upper lobe demonstrating a thick irregular wall and question either necrotic tissue or mycetoma. Additional new area of consolidation with suspect developing cavitation and necrosis in the lateral RIGHT upper lobe as well. Increased nodularity particularly in LEFT lower lobe. In light of the underlying chronic findings, the interval changes may represent progression of atypical  infection with areas of lung necrosis and cavitation though with the thickened irregular wall particularly in the LEFT upper lobe cavitary focus cannot completely exclude tumor. Recommend close pulmonology follow-up to exclude tumor as etiology. Electronically Signed   By: Ulyses SouthwardMark  Boles M.D.   On: 05/13/2016 16:21    Assessment/Plan: Jeremy Fields is a 70 y.o. 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 I suspect he has an acute bacterial superinfection causing the impressive changes on CT and current decline and the MAI is causing the more chronic changes along with ongoing tobacco abuse. 9/13 bronched with findings of copious white, thin secretions throughout. Washings obtained. Developed post bronch resp distress and intubated  Recommendations Continue vanco  Agree with change cefepime to zosyn Can add voriconazole empirically until fungal cx results back  Continue supportive care Would suggest treatment acutely for the bacterial process and then restart MAI treatment once more stable   Thank you very much for the consult. Will follow with you.  Asier Desroches P   May 29, 2016, 2:49 PM

## 2016-05-22 NOTE — H&P (Addendum)
PULMONARY / CRITICAL CARE MEDICINE   Name: Jeremy Fields MRN: [id number] DOB: [birth year]    ADMISSION DATE:  06/04/2016   CHIEF COMPLAINT:  Resp failure     HISTORY OF PRESENT ILLNESS:   70 yo white male admitted to ICU for acute resp failure patient admitted on 9/11 for progressive cavitary pneumonia Patient underwent bronchoscopy today and subsequently developed worsening SOB and increased WOB  Increased wheezing, using accessory muscles to breathe  Patient was transferred to ICU from PACU and was emergently intubated  PAST MEDICAL HISTORY :   has a past medical history of Atrial fibrillation ([place]); CHF (congestive heart failure) ([place]); COPD (chronic obstructive pulmonary disease) ([place]); Dysrhythmia; Hypertension; and Shortness of breath dyspnea.  has a past surgical history that includes Cardiac catheterization (Left, 04/26/2016). Prior to Admission medications   Medication Sig Start Date End Date Taking? Authorizing Provider  albuterol (PROVENTIL HFA;VENTOLIN HFA) 108 (90 Base) MCG/ACT inhaler Inhale 2 puffs into the lungs every 6 (six) hours as needed for wheezing or shortness of breath.   Yes Historical Provider, MD  ALPRAZolam Duanne Moron) 0.25 MG tablet Take 0.25 mg by mouth at bedtime as needed for sleep.   Yes Historical Provider, MD  amiodarone (PACERONE) 200 MG tablet Take 1 tablet (200 mg total) by mouth daily. 02/24/16  Yes Demetrios Loll, MD  atorvastatin (LIPITOR) 80 MG tablet Take 1 tablet by mouth daily. 04/01/16  Yes Historical Provider, MD  carvedilol (COREG) 3.125 MG tablet Take 1 tablet by mouth 2 (two) times daily. 05/02/16  Yes Historical Provider, MD  celecoxib (CELEBREX) 200 MG capsule Take 200 mg by mouth 2 (two) times daily.   Yes Historical Provider, MD  clarithromycin (BIAXIN) 500 MG tablet Take 1 tablet by mouth 2 (two) times daily. 04/29/16  Yes Historical Provider, MD  digoxin (LANOXIN) 0.125 MG tablet Take 1 tablet (0.125 mg total) by mouth daily. 02/24/16  Yes Demetrios Loll, MD  Fluticasone-Salmeterol (ADVAIR) 500-50 MCG/DOSE AEPB Inhale 1 puff into the lungs 2 (two) times daily.   Yes Historical Provider, MD  ipratropium-albuterol (DUONEB) 0.5-2.5 (3) MG/3ML SOLN Take 3 mLs by nebulization every 4 (four) hours as needed (for wheezing/shortness of breath).   Yes Historical Provider, MD  isoniazid (NYDRAZID) 300 MG tablet Take 1 tablet by mouth daily. 04/29/16  Yes Historical Provider, MD  metoprolol succinate (TOPROL-XL) 50 MG 24 hr tablet Take 50 mg by mouth daily. Take with or immediately following a meal.   Yes Historical Provider, MD  mirtazapine (REMERON) 15 MG tablet Take 15 mg by mouth at bedtime.   Yes Historical Provider, MD  Polyethyl Glycol-Propyl Glycol (SYSTANE) 0.4-0.3 % SOLN Apply to eye.   Yes Historical Provider, MD  predniSONE (DELTASONE) 5 MG tablet Take 10 mg by mouth daily. Take 6 tabs daily for 4 days, then 4 tabs daily for 4 days, then 2 tabs daily for 4 days.    Yes Historical Provider, MD  Rivaroxaban (XARELTO PO) Take by mouth.   Yes Historical Provider, MD  roflumilast (DALIRESP) 500 MCG TABS tablet Take 1 tablet (500 mcg total) by mouth daily. Patient not taking: Reported on 05/14/2016 02/24/16   Demetrios Loll, MD  Tiotropium Bromide Monohydrate (SPIRIVA RESPIMAT) 2.5 MCG/ACT AERS Inhale 1 puff into the lungs daily. Patient not taking: Reported on 04/26/2016 02/24/16   Demetrios Loll, MD   No Known Allergies  FAMILY HISTORY:  has no family status information on file.   SOCIAL HISTORY:  reports that he has been smoking.  He has never used smokeless tobacco. He reports that he does not drink alcohol or use drugs.  REVIEW OF SYSTEMS: can not provide due to critical illness    VITAL SIGNS: Temp:  [97.3 F (36.3 C)-98.9 F (37.2 C)] 98.2 F (36.8 C) (09/13 1153) Pulse Rate:  [81-116] 116 (09/13 1315) Resp:  [16-27] 23 (09/13 1315) BP: (89-172)/(50-112) 172/82 (09/13 1315) SpO2:  [55 %-100 %] 100 % (09/13 1315) HEMODYNAMICS:       INTAKE / OUTPUT:  Intake/Output Summary (Last 24 hours) at 06/02/2016 1352 Last data filed at 05/19/2016 1157  Gross per 24 hour  Intake              630 ml  Output             1345 ml  Net             -715 ml    PHYSICAL EXAMINATION: PHYSICAL EXAMINATION: Physical Examination:   GENERAL:critically ill appearing, +resp distress HEAD: Normocephalic, atraumatic.  EYES: Pupils equal, round, reactive to light.  No scleral icterus.  MOUTH: Moist mucosal membrane. NECK: Supple. No thyromegaly. No nodules. No JVD. c collar in place PULMONARY: Diffuse coarse rhonchi right sided +wheezes +rhonchi CARDIOVASCULAR: S1 and S2. Regular rate and rhythm. No murmurs, rubs, or gallops.  GASTROINTESTINAL: Soft, nontender, +distended. No masses. Positive bowel sounds. No hepatosplenomegaly.  MUSCULOSKELETAL: No swelling, clubbing, or edema.  NEUROLOGIC: GCS<8T SKIN:intact,warm,dry   LABS:  CBC  Recent Labs Lab 05/23/2016 1816 05/21/16 0439 05/19/2016 0339  WBC 19.1* 15.8* 16.8*  HGB 8.1* 7.4* 7.6*  HCT 25.6* 22.4* 23.5*  PLT 473* 451* 465*   Coag's No results for input(s): APTT, INR in the last 168 hours. BMET  Recent Labs Lab 05/26/2016 1328 05/23/2016 1816 05/21/16 0439  NA 135  --  135  K 4.2  --  4.0  CL 99*  --  105  CO2 29  --  25  BUN 20  --  17  CREATININE 0.78 0.79 0.64  GLUCOSE 112*  --  95   Electrolytes  Recent Labs Lab 05/16/2016 1328 06/05/2016 1816 05/21/16 0439  CALCIUM 8.6*  --  7.6*  MG 1.8 2.4  --   PHOS  --  3.4  --    Sepsis Markers  Recent Labs Lab 05/12/2016 1425 05/31/2016 1816  LATICACIDVEN 0.8 1.9   ABG No results for input(s): PHART, PCO2ART, PO2ART in the last 168 hours. Liver Enzymes  Recent Labs Lab 05/30/2016 1328  AST 22  ALT 17  ALKPHOS 54  BILITOT <0.1*  ALBUMIN 2.7*   Cardiac Enzymes  Recent Labs Lab 06/06/2016 1328  TROPONINI <0.03   Glucose No results for input(s): GLUCAP in the last 168 hours.  Imaging Dg Chest 1  View  Result Date: 05/14/2016 CLINICAL DATA:  Bronchospasm.  Difficulty breathing. EXAM: CHEST 1 VIEW COMPARISON:  CT chest 05/23/2016 FINDINGS: The heart is enlarged. Emphysematous changes are noted. There is no edema or effusion to suggest failure. Atherosclerotic changes are noted at the aortic arch. Bilateral upper lobe airspace opacities are stable. IMPRESSION: Stable bilateral upper lobe airspace opacities compatible with known cavitary lesions Cardiomegaly without failure. No significant interval change. Electronically Signed   By: San Morelle M.D.   On: 05/23/2016 13:28     70 yo white male with COPD,CHF with progrsssive cavitary lesion with acute resp failure from acute COPD exacerbation  S/p bronch with h/o MAI  PULMONARY -Respiratory Failure -continue Full MV support -continue Bronchodilator  Therapy -Wean Fio2 and PEEP as tolerated -will perform SAT/SBT when respiratory parameters are met -aggressive BD therapy -continue IV steroids -start vancomycin and zosyn    CARDIOVASCULAR Needs ICU monitoring  RENAL -follow chem 7 -follow UO -continue Foley Catheter-assess need   GASTROINTESTINAL OG placed, start TF's  HEMATOLOGIC Follow CBC  INFECTIOUS abx per ID will start Zosyn  ENDOCRINE - ICU hypoglycemic\Hyperglycemia protocol   NEUROLOGIC - intubated and sedated - minimal sedation to achieve a RASS goal: -1    I have personally obtained a history, examined the patient, evaluated Pertinent laboratory and RadioGraphic/imaging results, and  formulated the assessment and plan   The Patient requires high complexity decision making for assessment and support, frequent evaluation and titration of therapies, application of advanced monitoring technologies and extensive interpretation of multiple databases. Critical Care Time devoted to patient care services described in this note is 55 minutes.   Overall, patient is critically ill, prognosis is guarded.   Patient with Multiorgan failure and at high risk for cardiac arrest and death.   Will update family when available   Corrin Parker, M.D.  [facility]  Medical Director [place] Director [facility] Department

## 2016-05-22 NOTE — Procedures (Signed)
Endotracheal Intubation: Patient required placement of an artificial airway secondary to resp failure.   Consent: Emergent.   Hand washing performed prior to starting the procedure.   Medications administered for sedation prior to procedure: Midazolam 4 mg IV,  Vecuronium 10 mg IV, Fentanyl 100 mcg IV.   Procedure: A time out procedure was called and correct patient, name, & ID confirmed. Needed supplies and equipment were assembled and checked to include ETT, 10 ml syringe, Glidescope, Mac and Miller blades, suction, oxygen and bag mask valve, end tidal CO2 monitor. Patient was positioned to align the mouth and pharynx to facilitate visualization of the glottis.  Heart rate, SpO2 and blood pressure was continuously monitored during the procedure. Pre-oxygenation was conducted prior to intubation and endotracheal tube was placed through the vocal cords into the trachea.  During intubation an assistant applied gentle pressure to the cricoid cartilage.   The artificial airway was placed under direct visualization via glidescope route using a 8.0 ETT on the first attempt.    ETT was secured at 23 cm mark.    Placement was confirmed by auscuitation of lungs with good breath sounds bilaterally and no stomach sounds.  Condensation was noted on endotracheal tube.  Pulse ox 98%.  CO2 detector in place with appropriate color change.   Complications: None .   Operator: Riordan Walle.   Chest radiograph ordered and pending.   Comments: OGT placed via glidescope.  Anabella Capshaw David Demari Kropp, M.D.  [facility]  Medical Director ICU-[facility] [place] Medical Director [facility] Cardio-Pulmonary Department       

## 2016-05-23 ENCOUNTER — Encounter: Payer: Self-pay | Admitting: Internal Medicine

## 2016-05-23 DIAGNOSIS — A419 Sepsis, unspecified organism: Principal | ICD-10-CM

## 2016-05-23 DIAGNOSIS — J189 Pneumonia, unspecified organism: Secondary | ICD-10-CM

## 2016-05-23 LAB — BASIC METABOLIC PANEL
ANION GAP: 8 (ref 5–15)
BUN: 19 mg/dL (ref 6–20)
CALCIUM: 8.1 mg/dL — AB (ref 8.9–10.3)
CO2: 24 mmol/L (ref 22–32)
Chloride: 101 mmol/L (ref 101–111)
Creatinine, Ser: 0.67 mg/dL (ref 0.61–1.24)
Glucose, Bld: 198 mg/dL — ABNORMAL HIGH (ref 65–99)
Potassium: 4.2 mmol/L (ref 3.5–5.1)
Sodium: 133 mmol/L — ABNORMAL LOW (ref 135–145)

## 2016-05-23 LAB — CBC
HCT: 25.1 % — ABNORMAL LOW (ref 40.0–52.0)
Hemoglobin: 8.1 g/dL — ABNORMAL LOW (ref 13.0–18.0)
MCH: 27 pg (ref 26.0–34.0)
MCHC: 32.4 g/dL (ref 32.0–36.0)
MCV: 83.3 fL (ref 80.0–100.0)
PLATELETS: 575 10*3/uL — AB (ref 150–440)
RBC: 3.01 MIL/uL — ABNORMAL LOW (ref 4.40–5.90)
RDW: 15.7 % — AB (ref 11.5–14.5)
WBC: 29.1 10*3/uL — AB (ref 3.8–10.6)

## 2016-05-23 LAB — MAGNESIUM
Magnesium: 2.1 mg/dL (ref 1.7–2.4)
Magnesium: 2.3 mg/dL (ref 1.7–2.4)

## 2016-05-23 LAB — BLOOD GAS, ARTERIAL
ACID-BASE EXCESS: 2.2 mmol/L — AB (ref 0.0–2.0)
Bicarbonate: 28.9 mmol/L — ABNORMAL HIGH (ref 20.0–28.0)
FIO2: 0.6
Mechanical Rate: 15
O2 SAT: 99.4 %
PCO2 ART: 56 mmHg — AB (ref 32.0–48.0)
PEEP/CPAP: 5 cmH2O
PH ART: 7.32 — AB (ref 7.350–7.450)
Patient temperature: 37
VT: 550 mL
pO2, Arterial: 165 mmHg — ABNORMAL HIGH (ref 83.0–108.0)

## 2016-05-23 LAB — PHOSPHORUS
PHOSPHORUS: 4.5 mg/dL (ref 2.5–4.6)
Phosphorus: 3.8 mg/dL (ref 2.5–4.6)

## 2016-05-23 LAB — ACID FAST SMEAR (AFB, MYCOBACTERIA)

## 2016-05-23 LAB — GLUCOSE, CAPILLARY
GLUCOSE-CAPILLARY: 147 mg/dL — AB (ref 65–99)
GLUCOSE-CAPILLARY: 150 mg/dL — AB (ref 65–99)
GLUCOSE-CAPILLARY: 166 mg/dL — AB (ref 65–99)
Glucose-Capillary: 151 mg/dL — ABNORMAL HIGH (ref 65–99)
Glucose-Capillary: 160 mg/dL — ABNORMAL HIGH (ref 65–99)

## 2016-05-23 LAB — ACID FAST SMEAR (AFB): ACID FAST SMEAR - AFSCU2: NEGATIVE

## 2016-05-23 MED ORDER — IPRATROPIUM-ALBUTEROL 0.5-2.5 (3) MG/3ML IN SOLN
3.0000 mL | RESPIRATORY_TRACT | Status: DC
  Administered 2016-05-23 – 2016-05-24 (×8): 3 mL via RESPIRATORY_TRACT
  Filled 2016-05-23 (×5): qty 3

## 2016-05-23 MED ORDER — PANTOPRAZOLE SODIUM 40 MG IV SOLR
40.0000 mg | INTRAVENOUS | Status: DC
  Administered 2016-05-23 – 2016-05-24 (×2): 40 mg via INTRAVENOUS
  Filled 2016-05-23 (×2): qty 40

## 2016-05-23 MED ORDER — DOCUSATE SODIUM 50 MG/5ML PO LIQD
100.0000 mg | Freq: Two times a day (BID) | ORAL | Status: DC
  Administered 2016-05-23 (×2): 100 mg
  Filled 2016-05-23 (×2): qty 10

## 2016-05-23 MED ORDER — SENNOSIDES 8.8 MG/5ML PO SYRP
5.0000 mL | ORAL_SOLUTION | Freq: Two times a day (BID) | ORAL | Status: DC
  Administered 2016-05-23 (×2): 5 mL
  Filled 2016-05-23 (×2): qty 5

## 2016-05-23 MED ORDER — VITAL HIGH PROTEIN PO LIQD: 1000.0000 mL | ORAL | Status: DC

## 2016-05-23 NOTE — Progress Notes (Signed)
Pharmacy Antibiotic Note  Jeremy Fields is a 70 y.o. male admitted on 2016/06/29 with pneumonia and possible fungal infection. Pharmacy has been consulted for piperacillin/tazobactam, vanc and voriconazole dosing.  Plan: Currently receiving: Piperacillin/tazo 3.375 g IE q 8 hours Vanc 750 mg IV q 12 h with goal trough 15-20. Will order trough prior to 4th dose. Voriconazole 310 mg IV q 12 x 2 doses followed by 210 mg IV q 12 h.  Height: 5\' 10"  (177.8 cm) Weight: 113 lb 1.5 oz (51.3 kg) IBW/kg (Calculated) : 73  Temp (24hrs), Avg:96.7 F (35.9 C), Min:94.7 F (34.8 C), Max:97.3 F (36.3 C)   Recent Labs Lab 28-Feb-2016 1328 28-Feb-2016 1425 28-Feb-2016 1816 05/21/16 0439 05/13/2016 0339 05/10/2016 1400 05/23/16 0558  WBC 19.6*  --  19.1* 15.8* 16.8* 45.4* 29.1*  CREATININE 0.78  --  0.79 0.64  --  0.68 0.67  LATICACIDVEN  --  0.8 1.9  --   --   --   --     Estimated Creatinine Clearance: 64.1 mL/min (by C-G formula based on SCr of 0.67 mg/dL).    No Known Allergies  Antimicrobials this admission: Cefepime 9/11 >> 9/13 Isoniazid 9/11 >> 9/12 Zosyn 9/13 >> Vanc 9/11 >> Voriconazole 9/13 >>   Microbiology results: 9/13 Bronch wash: pending 9/11 BCx: NGTD 9/11 UCx: negative 9/12 MRSA PCR: Negative AFB, Legionella, Micoplasma pending  Thank you for allowing pharmacy to be a part of this patient's care.  Luisa HartScott Breeley Bischof, PharmD Clinical Pharmacist 05/23/2016 2:18 PM

## 2016-05-23 NOTE — Progress Notes (Signed)
Nutrition Follow-up  DOCUMENTATION CODES:   Severe malnutrition in context of acute illness/injury (but malnutrition may be chronic in nature as well)  INTERVENTION:  -TF: recommend Vital High Protein at goal of 60 ml/hr providing 1440 kcals, 1210 mL free water, 127 g protein. PEPuP protocol. Continue to assess  NUTRITION DIAGNOSIS:   Malnutrition related to acute illness as evidenced by moderate depletions of muscle mass, severe depletion of body fat, percent weight loss.  Being addressed via TF  GOAL:   Provide needs based on ASPEN/SCCM guidelines  MONITOR:   TF tolerance, Vent status, Labs, Weight trends  REASON FOR ASSESSMENT:   Consult Poor PO  ASSESSMENT:   70 yo male admitted with acute on chronic respiratory failure secondary to COPD and pneumonia  Pt underwent bronch on 9/13 and developed worsening respiratory failure requiring intubation.   Patient is currently intubated on ventilator support MV: 9.5 L/min Temp (24hrs), Avg:96.6 F (35.9 C), Min:94.7 F (34.8 C), Max:97.8 F (36.6 C)  OG tube located in stomach  Diet Order:  Diet NPO time specified  Skin:  Reviewed, no issues  Last BM:  9/12   Labs: sodium 133, potassium/phopshorus/magnesium wdl  Meds: fentanyl, levophed  Height:   Ht Readings from Last 1 Encounters:  2016/07/23 5\' 10"  (1.778 m)    Weight:   Wt Readings from Last 1 Encounters:  05/23/16 117 lb 4.6 oz (53.2 kg)    BMI:  Body mass index is 16.83 kg/m.  Estimated Nutritional Needs:   Kcal:  1446 kcals   Protein:  80-106 g  Fluid:  >/= 1.5 L  EDUCATION NEEDS:   No education needs identified at this time  Romelle StarcherCate Haruna Rohlfs MS, RD, LDN [phone] Pager  [phone] Weekend/On-Call Pager

## 2016-05-23 NOTE — Progress Notes (Signed)
Dr. Belia HemanKasa at bedside and RN made MD aware that patient was agitated and following commands requiring PRN versed.  MD gave order to not perform sedation vacation today stating that the patient needs to rest today.  MD also gave order to d/c normal saline.

## 2016-05-23 NOTE — Progress Notes (Signed)
Inpatient Diabetes Program Recommendations  AACE/ADA: New Consensus Statement on Inpatient Glycemic Control (2015)  Target Ranges:  Prepandial:   less than 140 mg/dL      Peak postprandial:   less than 180 mg/dL (1-2 hours)      Critically ill patients:  140 - 180 mg/dL   Results for Jerl MinaWARD, Dayln M (MRN [id number]) as of 05/23/2016 11:02  Ref. Range 05/13/2016 17:13 05/18/2016 19:54 05/23/2016 00:39 05/23/2016 04:00 05/23/2016 07:45  Glucose-Capillary Latest Ref Range: 65 - 99 mg/dL 409190 (H) 811141 (H) 914147 (H) 150 (H) 151 (H)   Review of Glycemic Control  Diabetes history: No Outpatient Diabetes medications: NA Current orders for Inpatient glycemic control: CBGs Q4H  Inpatient Diabetes Program Recommendations: Correction (SSI): Note glucose has ranged from 141-190 mg/dl over the past 24 hours. Patient is intubated, ordered steroids and tube feedings which are contributing to hyperglycemia. MD may want to consider ordering ICU Glycemic Control order set -Phase 1.  Thanks, Orlando PennerMarie Willard Madrigal, RN, MSN, CDE Diabetes Coordinator Inpatient Diabetes Program [phone] (Team Pager from 8am to 5pm) [phone] ([facility] office) [phone] [facility] office) [phone] [facility] office)

## 2016-05-23 NOTE — Progress Notes (Signed)
Chaplain rounded the unit to provide a compassionate presence and support to the patient and family. Patient appeared to be sleeping while family gathered for prayer.  Jefm PettyChaplain Americo Vallery [phone]

## 2016-05-23 NOTE — Progress Notes (Signed)
PULMONARY / CRITICAL CARE MEDICINE   Name: Billey Wojciak Winkels MRN: [id number] DOB: 1946-04-01    ADMISSION DATE:  06/07/2016   CHIEF COMPLAINT:  Resp failure     HISTORY OF PRESENT ILLNESS:   70 yo white male admitted to ICU for acute resp failure patient admitted on 9/11 for progressive cavitary pneumonia Patient underwent bronchoscopy today and subsequently developed worsening SOB and increased WOB  Increased wheezing, using accessory muscles to breathe  9/13 Patient was transferred to ICU from PACU and was emergently intubated 9/14 remains intubated, +rhonchi    REVIEW OF SYSTEMS: can not provide due to critical illness    VITAL SIGNS: Temp:  [94.7 F (34.8 C)-98.2 F (36.8 C)] 97.2 F (36.2 C) (09/14 0800) Pulse Rate:  [61-117] 82 (09/14 0800) Resp:  [11-27] 13 (09/14 0800) BP: (81-172)/(57-112) 102/67 (09/14 0800) SpO2:  [55 %-100 %] 100 % (09/14 0800) FiO2 (%):  [40 %-70 %] 60 % (09/14 0359) Weight:  [113 lb 1.5 oz (51.3 kg)-117 lb 4.6 oz (53.2 kg)] 117 lb 4.6 oz (53.2 kg) (09/14 0545) HEMODYNAMICS:    Vent Mode: PRVC FiO2 (%):  [40 %-70 %] 60 % Set Rate:  [15 bmp] 15 bmp Vt Set:  [550 mL] 550 mL PEEP:  [5 cmH20] 5 cmH20 Plateau Pressure:  [13 cmH20-16 cmH20] 16 cmH20 INTAKE / OUTPUT:  Intake/Output Summary (Last 24 hours) at 05/23/16 0815 Last data filed at 05/23/16 0700  Gross per 24 hour  Intake           2728.5 ml  Output              850 ml  Net           1878.5 ml    PHYSICAL EXAMINATION: Physical Examination:   GENERAL:critically ill appearing, on vent HEAD: Normocephalic, atraumatic.  EYES: Pupils equal, round, reactive to light.  No scleral icterus.  MOUTH: Moist mucosal membrane. NECK: Supple. No thyromegaly. No nodules. No JVD. c collar in place PULMONARY:  +wheezes +rhonchi CARDIOVASCULAR: S1 and S2. Regular rate and rhythm. No murmurs, rubs, or gallops.  GASTROINTESTINAL: Soft, nontender, +distended. No masses. Positive bowel sounds.  No hepatosplenomegaly.  MUSCULOSKELETAL: No swelling, clubbing, or edema.  NEUROLOGIC: GCS<8T SKIN:intact,warm,dry   LABS:  CBC  Recent Labs Lab 05/18/2016 0339 05/24/2016 1400 05/23/16 0558  WBC 16.8* 45.4* 29.1*  HGB 7.6* 8.6* 8.1*  HCT 23.5* 27.5* 25.1*  PLT 465* 640* 575*   Coag's No results for input(s): APTT, INR in the last 168 hours. BMET  Recent Labs Lab 05/21/16 0439 05/19/2016 1400 05/23/16 0558  NA 135 135 133*  K 4.0 3.9 4.2  CL 105 102 101  CO2 25 27 24   BUN 17 13 19   CREATININE 0.64 0.68 0.67  GLUCOSE 95 171* 198*   Electrolytes  Recent Labs Lab 05/21/16 0439 05/31/2016 1400 05/19/2016 1455 05/10/2016 1751 05/23/16 0558  CALCIUM 7.6* 8.2*  --   --  8.1*  MG  --  1.9 2.0 1.9 2.1  PHOS  --  4.7* 4.0 4.4 4.5   Sepsis Markers  Recent Labs Lab 06/04/2016 1425 05/19/2016 1816  LATICACIDVEN 0.8 1.9   ABG  Recent Labs Lab 06/05/2016 1510 05/23/16 0442  PHART 7.20* 7.32*  PCO2ART 64* 56*  PO2ART 69* 165*   Liver Enzymes  Recent Labs Lab 05/12/2016 1328 05/12/2016 1400  AST 22 17  ALT 17 14*  ALKPHOS 54 57  BILITOT <0.1* 0.6  ALBUMIN 2.7* 2.5*   Cardiac Enzymes  Recent Labs Lab 06/04/2016 1328  TROPONINI <0.03   Glucose  Recent Labs Lab 06/07/2016 1713 05/18/2016 1954 05/23/16 0039 05/23/16 0400 05/23/16 0745  GLUCAP 190* 141* 147* 150* 151*    Imaging Dg Chest 1 View  Result Date: 05/21/2016 CLINICAL DATA:  Status post intubation EXAM: CHEST 1 VIEW COMPARISON:  Portable chest x-ray of earlier today. FINDINGS: The endotracheal tube tip lies approximately 6.2 cm above the carina at the level of the clavicular heads. The esophagogastric tube tip projects below the inferior margin of the image. There is stable increased density peripherally in the right upper lobe and patchy increased density in the left upper lobe. The heart and pulmonary vascularity are normal. There is calcification in the wall of the thoracic aorta. IMPRESSION: Interval  intubation of the trachea and esophagus. Underlying COPD. Persistent alveolar opacities in the upper lobes bilaterally. Electronically Signed   By: David  Martinique M.D.   On: 05/30/2016 14:11   Dg Chest 1 View  Result Date: 05/12/2016 CLINICAL DATA:  Bronchospasm.  Difficulty breathing. EXAM: CHEST 1 VIEW COMPARISON:  CT chest 05/19/2016 FINDINGS: The heart is enlarged. Emphysematous changes are noted. There is no edema or effusion to suggest failure. Atherosclerotic changes are noted at the aortic arch. Bilateral upper lobe airspace opacities are stable. IMPRESSION: Stable bilateral upper lobe airspace opacities compatible with known cavitary lesions Cardiomegaly without failure. No significant interval change. Electronically Signed   By: San Morelle M.D.   On: 06/05/2016 13:28   Dg Abd 1 View  Result Date: 05/21/2016 CLINICAL DATA:  Orogastric tube placement. EXAM: ABDOMEN - 1 VIEW COMPARISON:  Chest radiograph same date.  Chest CT 06/08/2015. FINDINGS: Orogastric tube is looped in the proximal stomach. The visualized bowel gas pattern is normal. There is no evidence of free intraperitoneal air. Mid left abdominal calcification may reflect a renal calculus. Vascular calcifications and lumbar spondylosis are noted. IMPRESSION: Orogastric tube looped in the proximal stomach. Electronically Signed   By: Richardean Sale M.D.   On: 05/13/2016 14:11   Dg Chest Port 1 View  Result Date: 05/19/2016 CLINICAL DATA:  Central line placement. EXAM: PORTABLE CHEST 1 VIEW COMPARISON:  Radiograph earlier this day at 1348 hour. Chest CT 05/21/2016 FINDINGS: Tip of the right central line in the proximal SVC. No pneumothorax. Endotracheal and enteric tubes again seen. Emphysema with biapical cavitary lesions and consolidations, unchanged from prior radiograph. The heart is normal in size. No pleural effusion. IMPRESSION: 1. Tip of the right central line in the proximal SVC. No pneumothorax. 2. Endotracheal and  enteric tubes remain in place. 3. Unchanged appearance of the lungs with emphysema and bilateral cavitary and consolidative opacities. Electronically Signed   By: Jeb Levering M.D.   On: 05/10/2016 22:58     70 yo white male with COPD,CHF with progrsssive cavitary lesion with acute resp failure from acute COPD exacerbation  S/p bronch with h/o MAI  PULMONARY -Respiratory Failure -continue Full MV support -continue Bronchodilator Therapy -Wean Fio2 and PEEP as tolerated -will perform SAT/SBT when respiratory parameters are met -aggressive BD therapy -continue IV steroids -started vancomycin and zosyn    CARDIOVASCULAR Needs ICU monitoring  RENAL -follow chem 7 -follow UO -continue Foley Catheter-assess need   GASTROINTESTINAL OG placed, startedTF's  HEMATOLOGIC Follow CBC  INFECTIOUS abx per ID   ENDOCRINE - ICU hypoglycemic\Hyperglycemia protocol   NEUROLOGIC - intubated and sedated - minimal sedation to achieve a RASS goal: -1   Plan for SAT/SBT in next 24 hrs,  will update family when available   I have personally obtained a history, examined the patient, evaluated Pertinent laboratory and RadioGraphic/imaging results, and  formulated the assessment and plan   The Patient requires high complexity decision making for assessment and support, frequent evaluation and titration of therapies, application of advanced monitoring technologies and extensive interpretation of multiple databases. Critical Care Time devoted to patient care services described in this note is 35 minutes.   Overall, patient is critically ill, prognosis is guarded.  Patient with Multiorgan failure and at high risk for cardiac arrest and death.   Will update family when available   Corrin Parker, M.D.  [facility]  Medical Director [place] Director [facility] Cardio-Pulmonary Department

## 2016-05-24 LAB — CBC
HEMATOCRIT: 23.1 % — AB (ref 40.0–52.0)
HEMOGLOBIN: 7.3 g/dL — AB (ref 13.0–18.0)
MCH: 26.8 pg (ref 26.0–34.0)
MCHC: 31.6 g/dL — AB (ref 32.0–36.0)
MCV: 84.6 fL (ref 80.0–100.0)
Platelets: 563 10*3/uL — ABNORMAL HIGH (ref 150–440)
RBC: 2.73 MIL/uL — AB (ref 4.40–5.90)
RDW: 15.9 % — ABNORMAL HIGH (ref 11.5–14.5)
WBC: 37.5 10*3/uL — ABNORMAL HIGH (ref 3.8–10.6)

## 2016-05-24 LAB — BASIC METABOLIC PANEL
Anion gap: 6 (ref 5–15)
BUN: 30 mg/dL — AB (ref 6–20)
CHLORIDE: 103 mmol/L (ref 101–111)
CO2: 30 mmol/L (ref 22–32)
CREATININE: 0.83 mg/dL (ref 0.61–1.24)
Calcium: 8.5 mg/dL — ABNORMAL LOW (ref 8.9–10.3)
GFR calc Af Amer: >60 mL/min (ref >60)
GFR calc non Af Amer: >60 mL/min (ref >60)
Glucose, Bld: 146 mg/dL — ABNORMAL HIGH (ref 65–99)
POTASSIUM: 4.6 mmol/L (ref 3.5–5.1)
Sodium: 139 mmol/L (ref 135–145)

## 2016-05-24 LAB — GLUCOSE, CAPILLARY
GLUCOSE-CAPILLARY: 148 mg/dL — AB (ref 65–99)
Glucose-Capillary: 125 mg/dL — ABNORMAL HIGH (ref 65–99)
Glucose-Capillary: 166 mg/dL — ABNORMAL HIGH (ref 65–99)
Glucose-Capillary: 196 mg/dL — ABNORMAL HIGH (ref 65–99)

## 2016-05-24 MED ORDER — MORPHINE SULFATE (PF) 2 MG/ML IV SOLN
2.0000 mg | Freq: Once | INTRAVENOUS | Status: AC
  Administered 2016-05-24: 2 mg via INTRAVENOUS
  Filled 2016-05-24: qty 1

## 2016-05-24 MED ORDER — SODIUM CHLORIDE 0.9 % IV SOLN: 5.0000 mg/h | INTRAVENOUS | Status: DC

## 2016-05-24 MED ORDER — MORPHINE SULFATE (PF) 2 MG/ML IV SOLN
2.0000 mg | INTRAVENOUS | Status: AC
Start: 2016-05-24 — End: 2016-05-24
  Administered 2016-05-24: 2 mg via INTRAVENOUS
  Filled 2016-05-24: qty 1

## 2016-05-24 MED ORDER — MORPHINE 100MG IN NS 100ML (1MG/ML) PREMIX INFUSION
1.0000 mg/h | INTRAVENOUS | Status: DC
  Administered 2016-05-24: 5 mg/h via INTRAVENOUS
  Filled 2016-05-24: qty 100

## 2016-05-24 MED ORDER — LORAZEPAM 2 MG/ML IJ SOLN
2.0000 mg | Freq: Once | INTRAMUSCULAR | Status: AC
  Administered 2016-05-24: 2 mg via INTRAVENOUS
  Filled 2016-05-24: qty 1

## 2016-05-24 MED ORDER — LORAZEPAM 2 MG/ML IJ SOLN
2.0000 mg | INTRAMUSCULAR | Status: DC | PRN
  Administered 2016-05-24: 2 mg via INTRAVENOUS
  Filled 2016-05-24: qty 1

## 2016-05-24 MED ORDER — SODIUM CHLORIDE 0.9 % IV SOLN: 1.0000 mg/h | INTRAVENOUS | Status: DC

## 2016-05-24 MED ORDER — DEXMEDETOMIDINE HCL IN NACL 400 MCG/100ML IV SOLN
0.4000 ug/kg/h | INTRAVENOUS | Status: DC
  Administered 2016-05-24: 0.4 ug/kg/h via INTRAVENOUS
  Filled 2016-05-24: qty 100

## 2016-05-24 MED ORDER — MORPHINE 100MG IN NS 100ML (1MG/ML) PREMIX INFUSION: 5.0000 mg/h | INTRAVENOUS | Status: DC

## 2016-05-24 NOTE — Progress Notes (Signed)
Dr. Belia HemanKasa at bedside and assessed patient.  MD spoke with patient's daughter and gave order to this RN to start morphine drip because patient is struggling to breath more that before.

## 2016-05-24 NOTE — Progress Notes (Signed)
Pharmacy Antibiotic Note  Jeremy Fields is a 70 y.o. male admitted on 06/01/2016 with pneumonia and possible fungal infection. Pharmacy has been consulted for piperacillin/tazobactam and voriconazole dosing.  Plan: Currently receiving: Piperacillin/tazo 3.375 g IE q 8 hours Voriconazole 310 mg IV q 12 x 2 doses followed by 210 mg IV q 12 h.  Height: 5\' 10"  (177.8 cm) Weight: 115 lb 8.3 oz (52.4 kg) IBW/kg (Calculated) : 73  Temp (24hrs), Avg:98.3 F (36.8 C), Min:97.2 F (36.2 C), Max:99.5 F (37.5 C)   Recent Labs Lab 05/18/2016 1425 05/27/2016 1816 05/21/16 0439 08/19/2016 0339 08/19/2016 1400 05/23/16 0558 05/24/16 0610  WBC  --  19.1* 15.8* 16.8* 45.4* 29.1* 37.5*  CREATININE  --  0.79 0.64  --  0.68 0.67 0.83  LATICACIDVEN 0.8 1.9  --   --   --   --   --     Estimated Creatinine Clearance: 63.1 mL/min (by C-G formula based on SCr of 0.83 mg/dL).    No Known Allergies  Antimicrobials this admission: Cefepime 9/11 >> 9/13 Isoniazid 9/11 >> 9/12 Zosyn 9/13 >> Vanc 9/11 >>9/15 Voriconazole 9/13 >>   Microbiology results: 9/13 Bronch wash: pending 9/11 BCx: NGTD 9/11 UCx: negative 9/12 MRSA PCR: Negative AFB, Legionella, Micoplasma pending  Thank you for allowing pharmacy to be a part of this patient's care.  Demetrius Charityeldrin D. Cherl Gorney, PharmD Clinical Pharmacist 05/24/2016 2:10 PM

## 2016-05-24 NOTE — Progress Notes (Signed)
Patient alert and talking with his daughter.  Patient confused but alert to self.  Work of breathing improved since morphine drip was started.  Comfort measures in place.  Daughter at bedside.

## 2016-05-24 NOTE — Plan of Care (Signed)
Problem: Pain Managment: Goal: General experience of comfort will improve Outcome: Progressing Repositioned every 2 hours  Problem: Physical Regulation: Goal: Ability to maintain clinical measurements within normal limits will improve Outcome: Progressing On levophed. Map goal of 65 or greater  Problem: Nutrition: Goal: Adequate nutrition will be maintained Outcome: Progressing Tube feeds

## 2016-05-24 NOTE — Progress Notes (Signed)
Patient Failed SAT/SBT  Increased  WOB on PS mode After discussion with Family, they have agreed and consented to DNR status and will re-attempt SAT/SBT tomorrow    Lucie LeatherKurian David Harlow Basley, M.D.  [facility]  Medical Director [facility] Medical Director [facility]

## 2016-05-24 NOTE — Progress Notes (Signed)
PULMONARY / CRITICAL CARE MEDICINE   Name: Jeremy Fields MRN: [id number] DOB: [birth year]    ADMISSION DATE:  06/03/2016   CHIEF COMPLAINT:  Resp failure     HISTORY OF PRESENT ILLNESS:   70 yo white male admitted to ICU for acute resp failure patient admitted on 9/11 for progressive cavitary pneumonia Patient underwent bronchoscopy today and subsequently developed worsening SOB and increased WOB  Increased wheezing, using accessory muscles to breathe  9/13 Patient was transferred to ICU from PACU and was emergently intubated 9/14 remains intubated, +rhonchi 9/15 start precedex and assess for SAT/SBT when family arrives    REVIEW OF SYSTEMS: can not provide due to critical illness    VITAL SIGNS: Temp:  [96.8 F (36 C)-98.8 F (37.1 C)] 98.8 F (37.1 C) (09/15 0800) Pulse Rate:  [70-114] 114 (09/15 0800) Resp:  [12-19] 19 (09/15 0800) BP: (86-132)/(52-84) 132/84 (09/15 0800) SpO2:  [94 %-100 %] 96 % (09/15 0800) FiO2 (%):  [28 %] 28 % (09/15 0802) Weight:  [113 lb 1.5 oz (51.3 kg)-115 lb 8.3 oz (52.4 kg)] 115 lb 8.3 oz (52.4 kg) (09/15 0432) HEMODYNAMICS:    Vent Mode: PRVC FiO2 (%):  [28 %] 28 % Set Rate:  [15 bmp] 15 bmp Vt Set:  [550 mL] 550 mL PEEP:  [5 cmH20] 5 cmH20 INTAKE / OUTPUT:  Intake/Output Summary (Last 24 hours) at 05/24/16 0827 Last data filed at 05/24/16 0810  Gross per 24 hour  Intake          2728.72 ml  Output              995 ml  Net          1733.72 ml    PHYSICAL EXAMINATION: Physical Examination:   GENERAL:critically ill appearing, on vent HEAD: Normocephalic, atraumatic.  EYES: Pupils equal, round, reactive to light.  No scleral icterus.  MOUTH: Moist mucosal membrane. NECK: Supple. No thyromegaly. No nodules. No JVD. c collar in place PULMONARY:  +wheezes +rhonchi CARDIOVASCULAR: S1 and S2. Regular rate and rhythm. No murmurs, rubs, or gallops.  GASTROINTESTINAL: Soft, nontender, +distended. No masses. Positive bowel sounds.  No hepatosplenomegaly.  MUSCULOSKELETAL: No swelling, clubbing, or edema.  NEUROLOGIC: GCS<8T SKIN:intact,warm,dry   LABS:  CBC  Recent Labs Lab 05/14/2016 1400 05/23/16 0558 05/24/16 0610  WBC 45.4* 29.1* 37.5*  HGB 8.6* 8.1* 7.3*  HCT 27.5* 25.1* 23.1*  PLT 640* 575* 563*   Coag's No results for input(s): APTT, INR in the last 168 hours. BMET  Recent Labs Lab 05/13/2016 1400 05/23/16 0558 05/24/16 0610  NA 135 133* 139  K 3.9 4.2 4.6  CL 102 101 103  CO2 _0 BUN 13 19 30*  CREATININE 0.68 0.67 0.83  GLUCOSE 171* 198* 146*   Electrolytes  Recent Labs Lab 06/03/2016 1400  05/10/2016 1751 05/23/16 0558 05/23/16 1738 05/24/16 0610  CALCIUM 8.2*  --   --  8.1*  --  8.5*  MG 1.9  < > 1.9 2.1 2.3  --   PHOS 4.7*  < > 4.4 4.5 3.8  --   < > = values in this interval not displayed. Sepsis Markers  Recent Labs Lab 05/12/2016 1425 05/16/2016 1816  LATICACIDVEN 0.8 1.9   ABG  Recent Labs Lab 06/04/2016 1510 05/23/16 0442  PHART 7.20* 7.32*  PCO2ART 64* 56*  PO2ART 69* 165*   Liver Enzymes  Recent Labs Lab 05/27/2016 1328 05/16/2016 1400  AST 22 17  ALT 17 14*  ALKPHOS 54 37  BILITOT <0.1* 0.6  ALBUMIN 2.7* 2.5*   Cardiac Enzymes  Recent Labs Lab 06/03/2016 1328  TROPONINI <0.03   Glucose  Recent Labs Lab 05/23/16 0745 05/23/16 1249 05/23/16 1612 05/23/16 2353 05/24/16 0411 05/24/16 0735  GLUCAP 151* 166* 160* 196* 125* 148*    Imaging No results found.   70 yo white male with COPD,CHF with progrsssive cavitary lesion with acute resp failure from acute COPD exacerbation  S/p bronch with h/o MAI  PULMONARY-plan for SAT/SBt today when family arrives -Respiratory Failure -continue Full MV support -continue Bronchodilator Therapy -Wean Fio2 and PEEP as tolerated -will perform SAT/SBT when respiratory parameters are met -aggressive BD therapy -continue IV steroids -started vancomycin and zosyn    CARDIOVASCULAR Needs ICU  monitoring  RENAL -follow chem 7 -follow UO -continue Foley Catheter-assess need   GASTROINTESTINAL OG placed, startedTF's  HEMATOLOGIC Follow CBC  INFECTIOUS abx per ID   ENDOCRINE - ICU hypoglycemic\Hyperglycemia protocol   NEUROLOGIC - intubated and sedated - minimal sedation to achieve a RASS goal: -1 -start precedex    I have personally obtained a history, examined the patient, evaluated Pertinent laboratory and RadioGraphic/imaging results, and  formulated the assessment and plan   The Patient requires high complexity decision making for assessment and support, frequent evaluation and titration of therapies, application of advanced monitoring technologies and extensive interpretation of multiple databases. Critical Care Time devoted to patient care services described in this note is 35 minutes.   Overall, patient is critically ill, prognosis is guarded.  Patient with Multiorgan failure and at high risk for cardiac arrest and death.     Corrin Parker, M.D.  [facility]  Medical Director [place] Director [facility] Cardio[facility] Department

## 2016-05-24 NOTE — Progress Notes (Signed)
Patient with increased WOB and increased SOB, more agitated and using accessory muscles to breathe Patient given morphine and ativan and did not help,   I Have discussed poor resp status with family-both daughters, everyone is in agreement to  Proceed with making patient more comfortable Will proceed with comfort care measures.   Lucie LeatherKurian David Pearla Mckinny, M.D.  [facility]  Medical Director [facility] Medical Director [facility] Card[facility]

## 2016-05-24 NOTE — Progress Notes (Addendum)
Patient continues to be very agitated after fentanyl bolus and it is not time yet per PRN order for versed.  RN made Dr. Belia HemanKasa aware that patient is very agitated sitting up in bed, does follow simple commands.  MD gave order to start precedex drip prior to vent wean.

## 2016-05-24 NOTE — Progress Notes (Addendum)
Patient agitated with heart rate 130's stach and blood pressure elevated.  Patient stating his heart hurts and he is having trouble breathing.  MD gave order for 2mg  of ativan and 2mg  of morphine now.

## 2016-05-24 NOTE — Progress Notes (Signed)
Patient extubated at 1150 per Dr. Clovis Fredricksonkasa's order.  Extubated by Lottie RaterBrantley, RRT with RN at bedside.  Per Dr. Belia Hemankasa patient is not to be reintubated.  Patient much calmer now that he is extubated and continues to follow commands.  Per Dr. Belia HemanKasa levophed drip stopped and precedex drip also stopped.  Both daughters at bedside as well as patient's brother.  Continuing to monitor.

## 2016-05-25 LAB — CULTURE, BLOOD (ROUTINE X 2)
CULTURE: NO GROWTH
CULTURE: NO GROWTH

## 2016-05-25 LAB — BLOOD GAS, ARTERIAL
ALLENS TEST (PASS/FAIL): POSITIVE — AB
Acid-Base Excess: 4 mmol/L — ABNORMAL HIGH (ref 0.0–2.0)
BICARBONATE: 30.5 mmol/L — AB (ref 20.0–28.0)
FIO2: 28
O2 Saturation: 89 %
PATIENT TEMPERATURE: 37
PH ART: 7.36 (ref 7.350–7.450)
PO2 ART: 59 mmHg — AB (ref 83.0–108.0)
pCO2 arterial: 54 mmHg — ABNORMAL HIGH (ref 32.0–48.0)

## 2016-05-27 LAB — CULTURE, RESPIRATORY

## 2016-05-27 LAB — CULTURE, RESPIRATORY W GRAM STAIN: Special Requests: NORMAL

## 2016-05-28 DIAGNOSIS — D72829 Elevated white blood cell count, unspecified: Secondary | ICD-10-CM

## 2016-05-28 DIAGNOSIS — R0600 Dyspnea, unspecified: Secondary | ICD-10-CM

## 2016-05-28 DIAGNOSIS — J189 Pneumonia, unspecified organism: Secondary | ICD-10-CM

## 2016-05-31 LAB — LEGIONELLA PNEUMOPHILA/CULTURE: LEGIONELLA PNEUMOPHILA DFA: NEGATIVE

## 2016-05-31 LAB — LEGIONELLA CULTURE REFLEX

## 2016-06-09 NOTE — Progress Notes (Signed)
Sinusbrady on cardiac monitor.  Remains unable to maintain oxygen saturation.  Family remains at bedside.  Chaplin at bedside.  Emotional support provided.

## 2016-06-09 NOTE — Progress Notes (Signed)
Called patient daughter, Jeremy Fields, and advised her father's belongings (3 bags) were left in room after patient expired. She expressed her appreciation for Dr. Belia HemanKasa and everyone who cared for her father. She stated a family member will come by today to collect their belongings.

## 2016-06-09 NOTE — Progress Notes (Signed)
Patient without audible heart beat.  No blood pressure.  No respiration.  Time of Death 390040. Family at bedside.  Chaplin present.

## 2016-06-09 NOTE — Discharge Summary (Signed)
PULMONARY / CRITICAL CARE MEDICINE   Name: Jeremy SantiagoDennis M Mcilhenny MRN: [id number] DOB: 04-Nov-1945    ADMISSION DATE:  09-12-15   CHIEF COMPLAINT:  Resp failure  Admission diagnosis Acute respiratory failure Sepsis due to pneumonia Pneumonia Severe protein calorie malnutrition  Discharge daignosis Acute respiratory failure Sepsis due to pneumonia Pneumonia Severe protein calorie malnutrition   HISTORY OF PRESENT ILLNESS:   70 yo white male admitted to ICU for acute resp failure patient admitted on 9/11 for progressive cavitary pneumonia Patient underwent bronchoscopy 05/26/2016 and subsequently developed worsening SOB and increased WOB  Increased wheezing, using accessory muscles to breathe. See admission h&p for details  Past Medical History:  Diagnosis Date  . Atrial fibrillation (HCC)   . CHF (congestive heart failure) (HCC)   . COPD (chronic obstructive pulmonary disease) (HCC)   . Dysrhythmia   . Hypertension   . Shortness of breath dyspnea     REVIEW OF SYSTEMS: can not provide due to critical illness   VITAL SIGNS: Temp:  [95.7 F (35.4 C)-99.5 F (37.5 C)] 95.7 F (35.4 C) (09/16 0040) Pulse Rate:  [45-119] 117 (09/16 0038) Resp:  [0-26] 0 (09/16 0040) BP: (75-152)/(52-92) 145/78 (09/16 0020) SpO2:  [7 %-98 %] 78 % (09/16 0038) HEMODYNAMICS:      INTAKE / OUTPUT:  Intake/Output Summary (Last 24 hours) at 05/17/2016 0913 Last data filed at 05/24/16 2000  Gross per 24 hour  Intake            158.6 ml  Output              450 ml  Net           -291.4 ml    PHYSICAL EXAMINATION: Physical Examination:   GENERAL:cachetic HEAD: Normocephalic, atraumatic.  EYES: Pupils equal, round, unreactive to light.  No scleral icterus.  MOUTH: dry mucosal membrane. NECK: Supple. No thyromegaly. No nodules. No JVD PULMONARY:  No chest movement, no breath sounds CARDIOVASCULAR: No heart  Heart tones GASTROINTESTINAL: No bowel sounds MUSCULOSKELETAL: No swelling,  clubbing, or edema.  NEUROLOGIC: GCS<8T SKIN: decreased turgor   LABS:  CBC  Recent Labs Lab 05/28/2016 1400 05/23/16 0558 05/24/16 0610  WBC 45.4* 29.1* 37.5*  HGB 8.6* 8.1* 7.3*  HCT 27.5* 25.1* 23.1*  PLT 640* 575* 563*   Coag's No results for input(s): APTT, INR in the last 168 hours. BMET  Recent Labs Lab 05/21/2016 1400 05/23/16 0558 05/24/16 0610  NA 135 133* 139  K 3.9 4.2 4.6  CL 102 101 103  CO2 27 24 30   BUN 13 19 30*  CREATININE 0.68 0.67 0.83  GLUCOSE 171* 198* 146*   Electrolytes  Recent Labs Lab 05/13/2016 1400  05/10/2016 1751 05/23/16 0558 05/23/16 1738 05/24/16 0610  CALCIUM 8.2*  --   --  8.1*  --  8.5*  MG 1.9  < > 1.9 2.1 2.3  --   PHOS 4.7*  < > 4.4 4.5 3.8  --   < > = values in this interval not displayed. Sepsis Markers  Recent Labs Lab 10/02/2015 1425 10/02/2015 1816  LATICACIDVEN 0.8 1.9   ABG  Recent Labs Lab 06/07/2016 1510 05/23/16 0442 05/24/16 0930  PHART 7.20* 7.32* 7.36  PCO2ART 64* 56* 54*  PO2ART 69* 165* 59*   Liver Enzymes  Recent Labs Lab 10/02/2015 1328 05/10/2016 1400  AST 22 17  ALT 17 14*  ALKPHOS 54 57  BILITOT <0.1* 0.6  ALBUMIN 2.7* 2.5*   Cardiac Enzymes  Recent  Labs Lab 2016/06/10 1328  TROPONINI <0.03   Glucose  Recent Labs Lab 05/23/16 1249 05/23/16 1612 05/23/16 2353 05/24/16 0411 05/24/16 0735 05/24/16 1135  GLUCAP 166* 160* 196* 125* 148* 166*    Imaging No results found.  Hospital Course  70 yo white male with COPD,CHF with progrsssive cavitary lesion with acute resp failure from acute COPD exacerbation  S/p bronch with h/o MAI  9/13 Patient was transferred to ICU from PACU and was emergently intubated. Maintained on Mechanical ventilation, bronchodilator therapy, antibiotics and systemic steroids. 9/14 remains intubated, +rhonchi; failed SBT attempt. He was seen by ID 9/15 start precedex and assessed for SAT/SBT but failed. After discussion with family patient was made  DNR/DNI/Comfort care. He was started on a morphine infusion and titrated to comfort    Disposition Patient expired at 0040 on 06/07/2016 Family at bedside. Chaplain notified and support provided  Total time=45 minutes  Jamarquis Crull S. [facility] ANP-BC Pulmonary and Critical Care Medicine [facility] Pager [phone] or [phone]

## 2016-06-09 NOTE — Progress Notes (Signed)
Patient unable to maintain oxygen saturation.  100% NRB in use.  Family at bedside.

## 2016-06-09 DEATH — deceased

## 2016-06-11 LAB — MYCOPLASMA PNEUMONIAE CULTURE

## 2016-06-12 LAB — CULTURE, FUNGUS WITHOUT SMEAR: Special Requests: NORMAL

## 2016-07-09 LAB — ACID FAST CULTURE WITH REFLEXED SENSITIVITIES (MYCOBACTERIA): Acid Fast Culture: NEGATIVE

## 2016-07-09 LAB — ACID FAST CULTURE WITH REFLEXED SENSITIVITIES

## 2018-01-31 IMAGING — CR DG CHEST 2V
1 series · 2 of 2 positions shown · non-contrast
Comparison: PA and lateral chest x-ray February 21, 2016 and January 03, 2016

CLINICAL DATA: Persistent lobar pneumonia, cough, chest congestion,
shortness of breath, and generalize weakness; history of atrial
fibrillation, COPD, current smoker.

EXAM:
CHEST  2 VIEW

[Series 1: dg chest 2 view · 0.14mm/px · 2 of 2 slices shown]
[im 1/2]
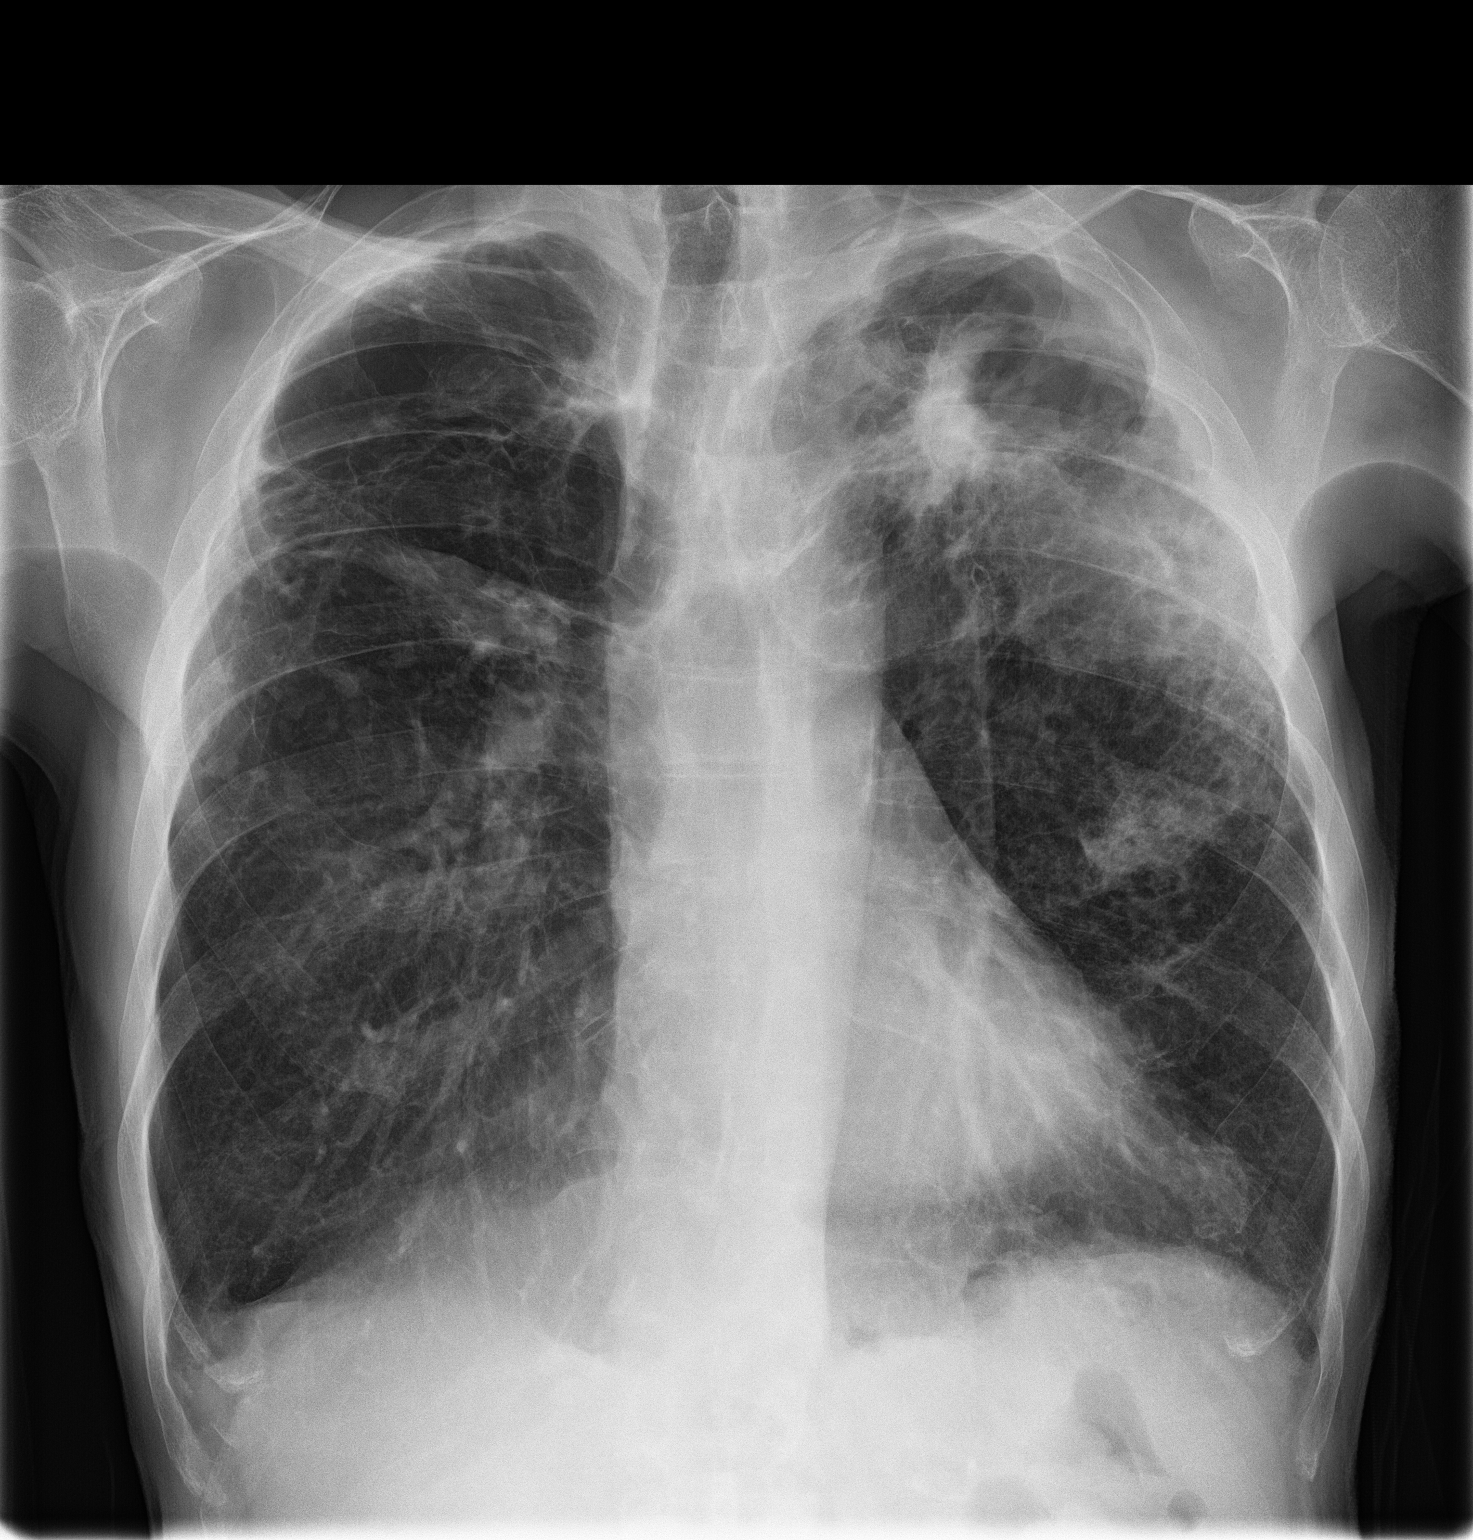
[im 2/2]
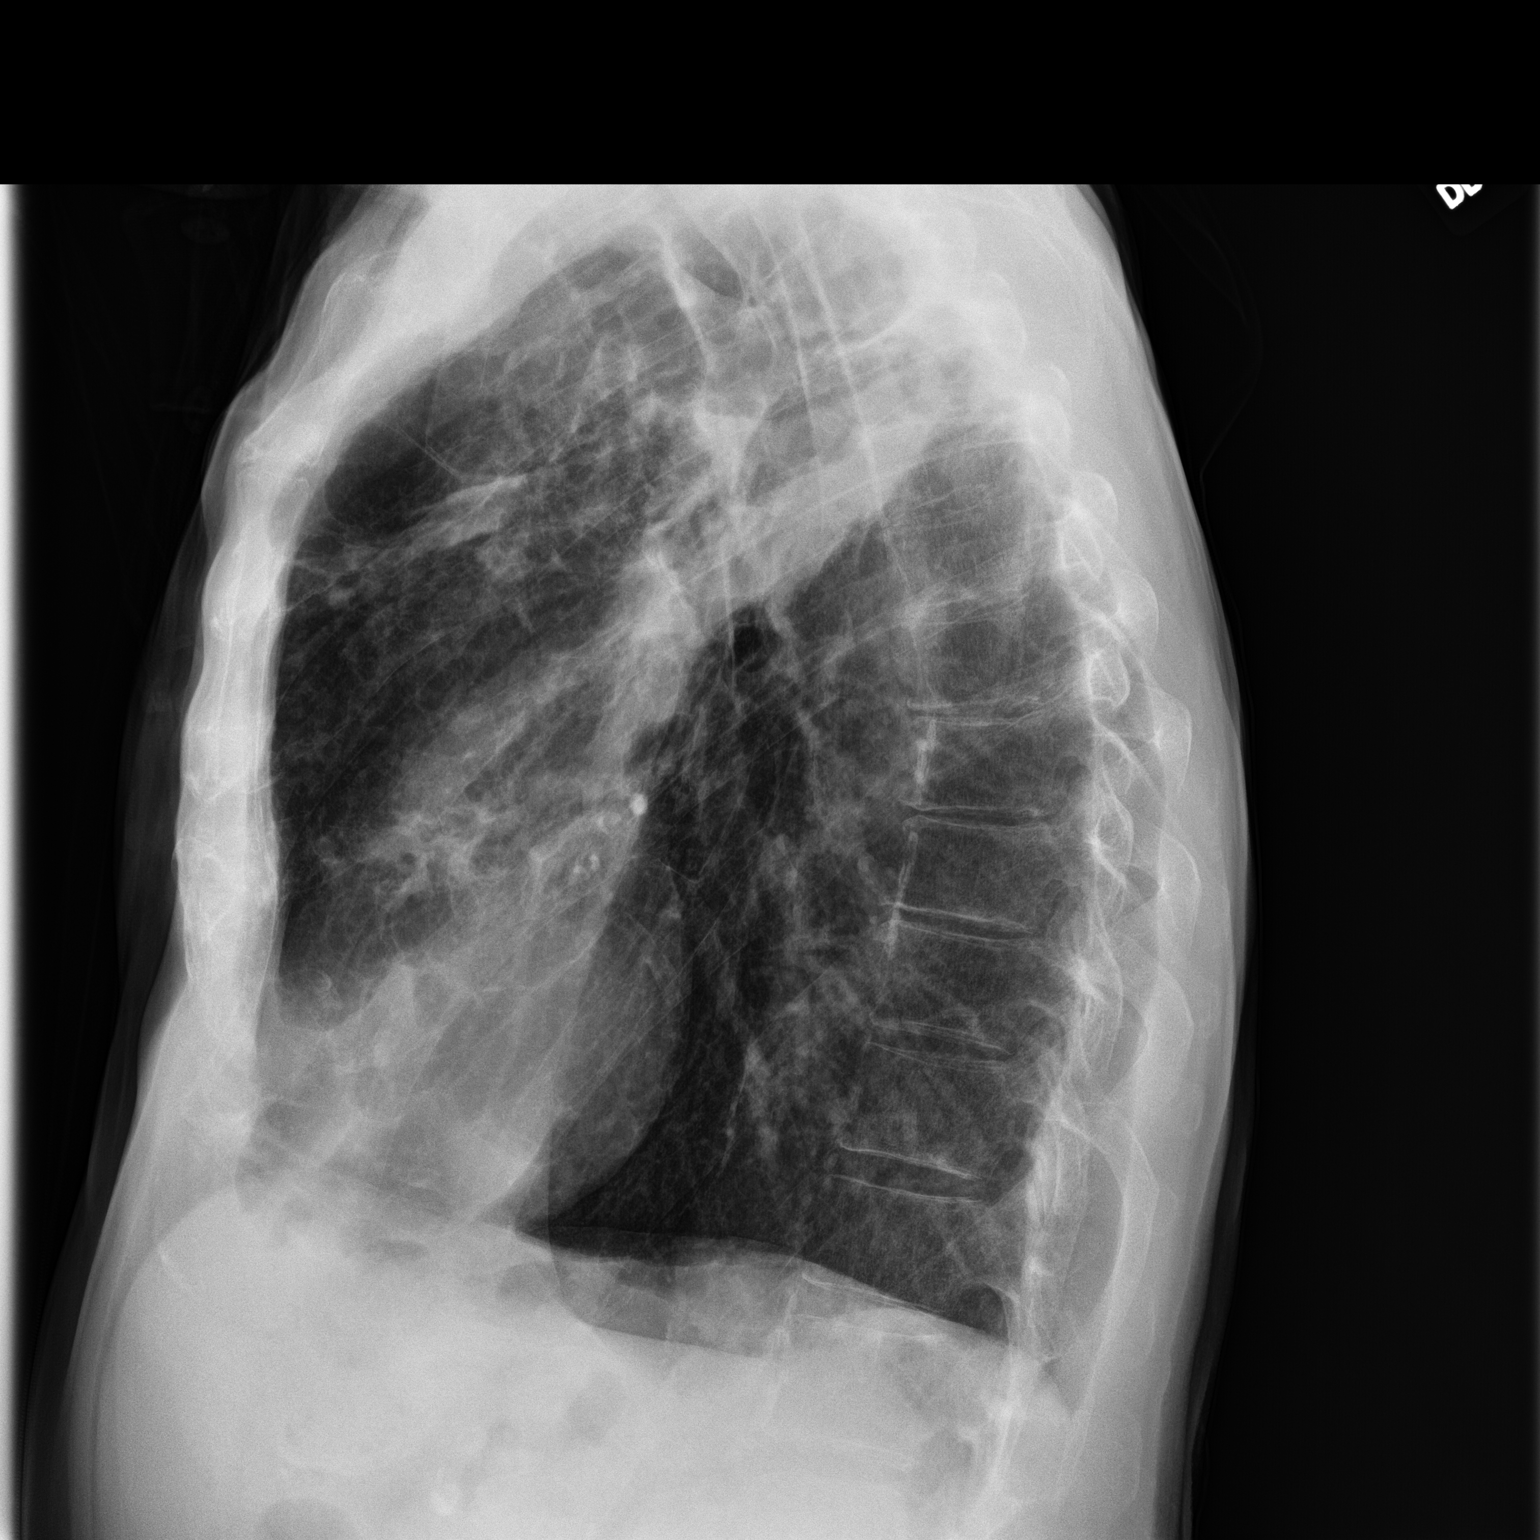

[2 of 2 positions shown; findings below may reference images not displayed]

FINDINGS: The lungs remain hyperinflated. Patchy interstitial density in the
left mid and upper lung is more conspicuous today. The irregular
area of dense consolidation in the left upper lobe is more
conspicuous. There is perihilar interstitial prominence on the right
which is stable. There is no significant pleural effusion or
pneumothorax. The heart and pulmonary vascularity are normal. The
bones are osteopenic. There is no thoracic compression fracture.
IMPRESSION: Interval worsening of interstitial and alveolar opacity in the left
upper lobe consistent with pneumonia either typical or atypical.
Stable right perihilar prominence. COPD. No CHF.

Chest CT scanning is recommended.

## 2018-03-21 IMAGING — CR DG CHEST 2V
1 series · 4 of 4 positions shown · non-contrast
Comparison: Chest x-ray of April 01, 2016 and January 03, 2016.

CLINICAL DATA: Severe shortness of breath.  Known COPD

EXAM:
CHEST  2 VIEW

[Series 1: dg chest 2 view · 0.14mm/px · 4 of 4 slices shown]
[im 1/4]
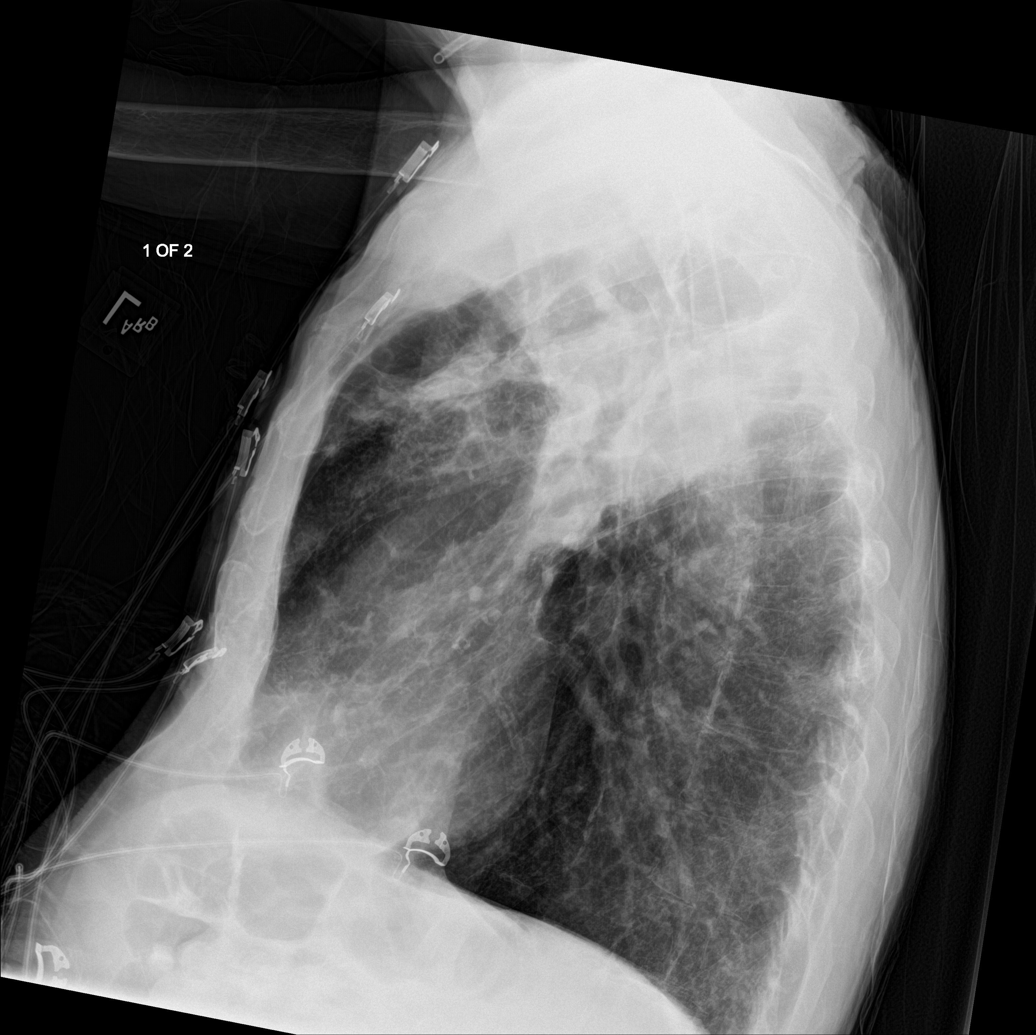
[im 2/4]
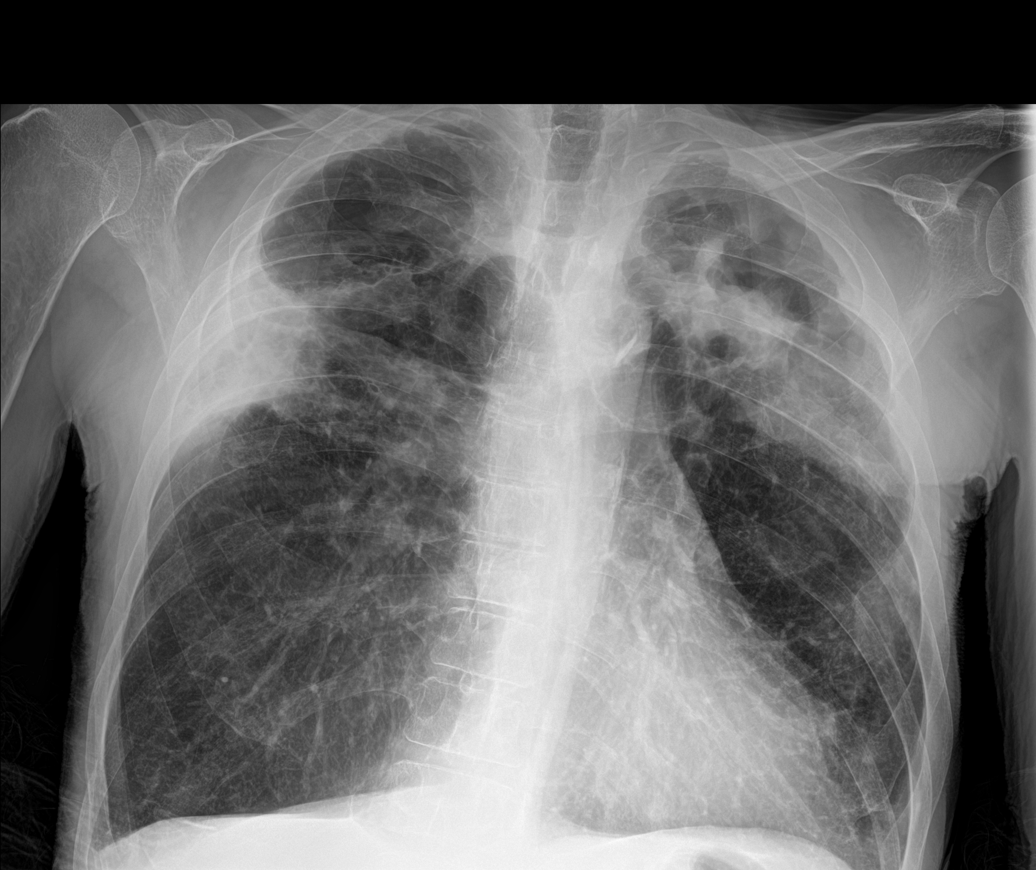
[im 3/4]
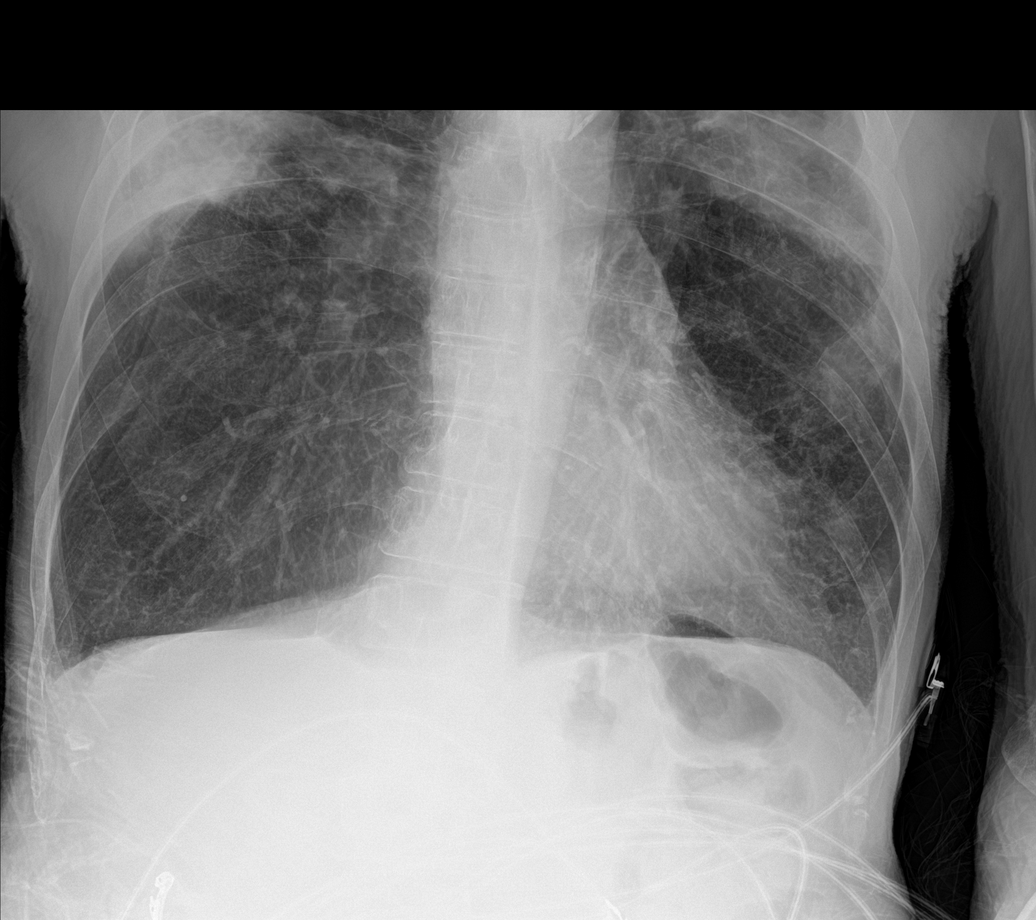
[im 4/4]
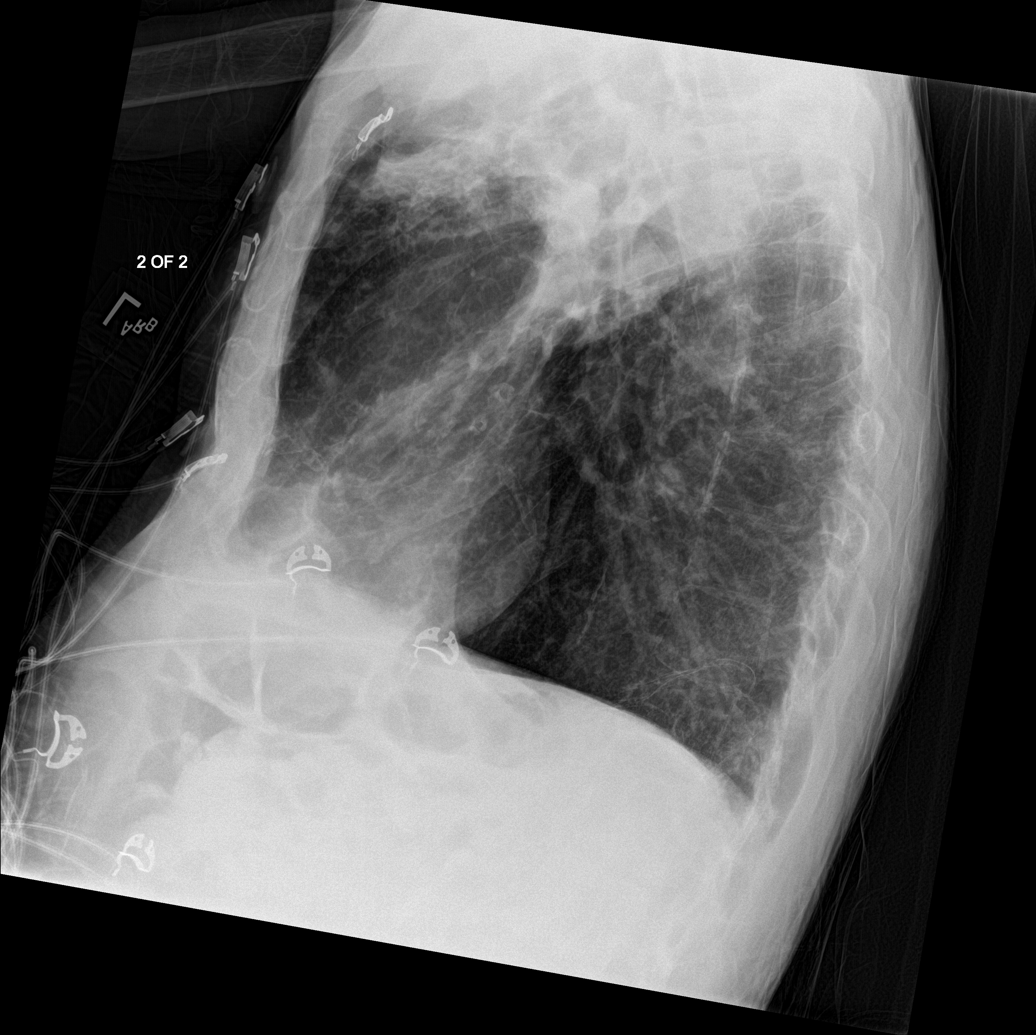

[4 of 4 positions shown; findings below may reference images not displayed]

FINDINGS: The lungs are hyperinflated. There are confluent alveolar opacities
in both upper lobes. On the right the findings are new and are
peripherally located. On the left the findings are not entirely new
but are more conspicuous than on the previous exam. The lower lobes
and the middle lobe appears spared. The heart and pulmonary
vascularity are normal. There is calcification in the wall of the
aortic arch. The mediastinum is normal in width. The bony thorax
exhibits no acute abnormality.
IMPRESSION: Confluent alveolar opacities in both upper lobes worrisome for
pneumonia which may be due to typical bacterial etiologies or due to
tuberculosis or M. avium intracellulaire. There has been
considerable interval deterioration in the appearance of the chest
since the [REDACTED] and [DATE] studies.
